# Patient Record
Sex: Female | Born: 1970 | Race: White | Hispanic: No | Marital: Single | Smoking: Never smoker
Health system: Southern US, Community
[De-identification: ages and names within clinical notes are randomized; demographics above are authoritative.]

## PROBLEM LIST (undated history)

## (undated) DIAGNOSIS — Z9289 Personal history of other medical treatment: Secondary | ICD-10-CM

## (undated) DIAGNOSIS — Z803 Family history of malignant neoplasm of breast: Secondary | ICD-10-CM

## (undated) DIAGNOSIS — F419 Anxiety disorder, unspecified: Secondary | ICD-10-CM

## (undated) DIAGNOSIS — R519 Headache, unspecified: Secondary | ICD-10-CM

## (undated) DIAGNOSIS — A6000 Herpesviral infection of urogenital system, unspecified: Secondary | ICD-10-CM

## (undated) DIAGNOSIS — R51 Headache: Secondary | ICD-10-CM

## (undated) HISTORY — DX: Family history of malignant neoplasm of breast: Z80.3

## (undated) HISTORY — DX: Anxiety disorder, unspecified: F41.9

## (undated) HISTORY — DX: Headache: R51

## (undated) HISTORY — DX: Headache, unspecified: R51.9

## (undated) HISTORY — DX: Herpesviral infection of urogenital system, unspecified: A60.00

## (undated) HISTORY — PX: NOSE SURGERY: SHX723

## (undated) HISTORY — DX: Personal history of other medical treatment: Z92.89

## (undated) HISTORY — PX: FRACTURE SURGERY: SHX138

---

## 1993-10-09 HISTORY — PX: KNEE ARTHROSCOPY: SUR90

## 1998-10-09 HISTORY — PX: APPENDECTOMY: SHX54

## 2014-06-02 LAB — HM PAP SMEAR

## 2015-05-17 ENCOUNTER — Other Ambulatory Visit: Payer: Self-pay | Admitting: Obstetrics and Gynecology

## 2015-05-17 DIAGNOSIS — R928 Other abnormal and inconclusive findings on diagnostic imaging of breast: Secondary | ICD-10-CM

## 2015-05-18 ENCOUNTER — Ambulatory Visit
Admission: RE | Admit: 2015-05-18 | Discharge: 2015-05-18 | Disposition: A | Discharge status: Home / Self Care | Payer: BLUE CROSS/BLUE SHIELD | Source: Ambulatory Visit | Attending: Obstetrics and Gynecology | Admitting: Obstetrics and Gynecology

## 2015-05-18 ENCOUNTER — Ambulatory Visit: Payer: Self-pay

## 2015-05-18 DIAGNOSIS — R928 Other abnormal and inconclusive findings on diagnostic imaging of breast: Secondary | ICD-10-CM | POA: Insufficient documentation

## 2015-05-18 LAB — HM MAMMOGRAPHY

## 2017-02-26 ENCOUNTER — Other Ambulatory Visit: Payer: Self-pay | Admitting: Obstetrics and Gynecology

## 2017-02-26 DIAGNOSIS — Z1231 Encounter for screening mammogram for malignant neoplasm of breast: Secondary | ICD-10-CM

## 2017-03-01 ENCOUNTER — Other Ambulatory Visit: Payer: Self-pay | Admitting: Obstetrics and Gynecology

## 2017-03-02 ENCOUNTER — Ambulatory Visit
Admission: RE | Admit: 2017-03-02 | Discharge: 2017-03-02 | Disposition: A | Discharge status: Home / Self Care | Payer: BLUE CROSS/BLUE SHIELD | Source: Ambulatory Visit | Attending: Obstetrics and Gynecology | Admitting: Obstetrics and Gynecology

## 2017-03-02 DIAGNOSIS — Z1231 Encounter for screening mammogram for malignant neoplasm of breast: Secondary | ICD-10-CM | POA: Diagnosis not present

## 2017-03-03 ENCOUNTER — Encounter: Payer: Self-pay | Admitting: Obstetrics and Gynecology

## 2017-03-06 ENCOUNTER — Other Ambulatory Visit: Payer: Self-pay

## 2017-03-06 ENCOUNTER — Telehealth: Payer: Self-pay | Admitting: Obstetrics and Gynecology

## 2017-03-06 MED ORDER — JUNEL FE 1/20 1-20 MG-MCG PO TABS: 1.0000 | ORAL_TABLET | Freq: Every day | ORAL | 2 refills | Status: DC

## 2017-03-06 NOTE — Telephone Encounter (Signed)
Patient has annual scheduled for June 13th.  [facility] [address] [place]

## 2017-03-06 NOTE — Telephone Encounter (Signed)
Pt aware refills eRx'd. 

## 2017-03-16 ENCOUNTER — Telehealth: Payer: Self-pay | Admitting: Obstetrics and Gynecology

## 2017-03-16 NOTE — Telephone Encounter (Signed)
Pt aware refills given on 03/06/17.

## 2017-03-16 NOTE — Telephone Encounter (Signed)
Patient had to change her appt for her annual to July 17th, however she will need a refill on her bc.   She will be out of town.

## 2017-03-16 NOTE — Telephone Encounter (Signed)
Pt should have enough RF, like RN said. Check with pharmacy to make sure they have them. If not, I'll resend. Thx.

## 2017-03-21 ENCOUNTER — Ambulatory Visit: Payer: Self-pay | Admitting: Obstetrics and Gynecology

## 2017-03-27 ENCOUNTER — Other Ambulatory Visit: Payer: Self-pay | Admitting: Obstetrics and Gynecology

## 2017-04-09 ENCOUNTER — Telehealth: Payer: Self-pay

## 2017-04-09 NOTE — Telephone Encounter (Signed)
Pt has appt c [facility] on the 17th.  She's been experiencing some migraine/bad headaches the last few weeks.  Thinks it's related to menopause b/c she's having sxs like hot flashes, and she been reading a lot about it.  She is getting ready to go on vacation for 10d and wants to know what she can take to get relief in the meantime.  Adv to per CLG needs workup from PCP which she states she does not have.  Adv to avoid triggers such as cheese, ETOH (which will be involved on vac), [place], chocolate, caffeine, red wine.  May take Excedrine Migraine, shield eyes from sun.  Pt aware I will send this msg to [facility].  However I can't promise when she will hear back from her as she in on vac in a remote area.  [phone]

## 2017-04-16 NOTE — Telephone Encounter (Signed)
LM for pt. Given new onset of bad headaches, pt needs to f/u with PCP for further eval. May be hormonal but don't want to assume that.

## 2017-04-24 ENCOUNTER — Ambulatory Visit (INDEPENDENT_AMBULATORY_CARE_PROVIDER_SITE_OTHER): Payer: BLUE CROSS/BLUE SHIELD | Admitting: Obstetrics and Gynecology

## 2017-04-24 ENCOUNTER — Encounter: Payer: Self-pay | Admitting: Obstetrics and Gynecology

## 2017-04-24 VITALS — BP 120/76 | HR 76 | Ht 71.0 in | Wt 174.0 lb

## 2017-04-24 DIAGNOSIS — Z0000 Encounter for general adult medical examination without abnormal findings: Secondary | ICD-10-CM

## 2017-04-24 DIAGNOSIS — Z3041 Encounter for surveillance of contraceptive pills: Secondary | ICD-10-CM | POA: Diagnosis not present

## 2017-04-24 DIAGNOSIS — Z113 Encounter for screening for infections with a predominantly sexual mode of transmission: Secondary | ICD-10-CM | POA: Diagnosis not present

## 2017-04-24 DIAGNOSIS — Z01419 Encounter for gynecological examination (general) (routine) without abnormal findings: Secondary | ICD-10-CM | POA: Diagnosis not present

## 2017-04-24 DIAGNOSIS — A6004 Herpesviral vulvovaginitis: Secondary | ICD-10-CM | POA: Diagnosis not present

## 2017-04-24 DIAGNOSIS — Z1321 Encounter for screening for nutritional disorder: Secondary | ICD-10-CM | POA: Diagnosis not present

## 2017-04-24 DIAGNOSIS — Z124 Encounter for screening for malignant neoplasm of cervix: Secondary | ICD-10-CM

## 2017-04-24 DIAGNOSIS — Z13220 Encounter for screening for lipoid disorders: Secondary | ICD-10-CM

## 2017-04-24 DIAGNOSIS — A6000 Herpesviral infection of urogenital system, unspecified: Secondary | ICD-10-CM | POA: Insufficient documentation

## 2017-04-24 MED ORDER — JUNEL FE 1/20 1-20 MG-MCG PO TABS: 1.0000 | ORAL_TABLET | Freq: Every day | ORAL | 12 refills | Status: DC

## 2017-04-24 NOTE — Progress Notes (Signed)
Chief Complaint  Patient presents with  . Gynecologic Exam    headaches week before cycles     HPI:      [name] is a 46 y.o. G0P0000 who LMP was Patient's last menstrual period was 03/19/2017 (approximate)., presents today for her annual examination.  Her menses are regular every 1-2 months very light, lasting 2 days.  Dysmenorrhea none. She does not have intermenstrual bleeding. She has had tension type headaches the past 2 pill packs on the 3rd wk of OCPs. NSAIDs not helpful. No hx of migraines.   Sex activity: single partner, contraception - OCP (estrogen/progesterone).  Last Pap: June 02, 2014  Results were: no abnormalities /neg HPV DNA  Hx of STDs: HSV, takes valtrex prn sx. Doesn't need Rx RF currently.  Last mammogram:03/02/17  Results were: normal--routine follow-up in 12 months There is a FH of breast cancer in her PGM, genetic testing not indicated. There is no FH of ovarian cancer. The patient does do self-breast exams.  Tobacco use: The patient denies current or previous tobacco use. Alcohol use: glass of wine with dinner Exercise: very active  She does get adequate calcium and Vitamin D in her diet.  She would like fasting labs this yr.   Past Medical History:  Diagnosis Date  . Anxiety   . Family history of breast cancer   . Head ache   . Herpes genitalis   . History of mammogram 04/2013; 05/18/15   neg; neg  . History of Papanicolaou smear of cervix 12/14/10; 06/02/14   -/-; -/-    Past Surgical History:  Procedure Laterality Date  . APPENDECTOMY  2000  . KNEE ARTHROSCOPY Left 1995  . NOSE SURGERY      Family History  Problem Relation Age of Onset  . Breast cancer Paternal Grandmother 465  . Cancer Maternal Grandfather 75       brain, lung  . Thyroid disease Mother        hypothyroidism  . Cancer Maternal Grandmother [id number]       stomach    Social History   Social History  . Marital status: Single    Spouse name: N/A  . Number of  children: 0  . Years of education: 5316   Occupational History  . manufacturing     [place]   Social History Main Topics  . Smoking status: Never Smoker  . Smokeless tobacco: Never Used  . Alcohol use Yes     Comment: occ  . Drug use: No  . Sexual activity: Yes    Birth control/ protection: Pill   Other Topics Concern  . Not on file   Social History Narrative  . No narrative on file     Current Outpatient Prescriptions:  .  JUNEL FE 1/20 1-20 MG-MCG tablet, Take 1 tablet by mouth daily., Disp: 1 Package, Rfl: 12 .  valACYclovir (VALTREX) 500 MG tablet, TAKE 1 TABLET BY MOUTH TWICE DAILY FOR 3 DAYS WITH SYMPTOMS OR DAILY AS NEEDED FOR SYMPTOMS, Disp: 30 tablet, Rfl: 0  ROS:  Review of Systems  Constitutional: Negative for fatigue, fever and unexpected weight change.  Respiratory: Negative for cough, shortness of breath and wheezing.   Cardiovascular: Negative for chest pain, palpitations and leg swelling.  Gastrointestinal: Negative for blood in stool, constipation, diarrhea, nausea and vomiting.  Endocrine: Negative for cold intolerance, heat intolerance and polyuria.  Genitourinary: Negative for dyspareunia, dysuria, flank pain, frequency, genital sores, hematuria, menstrual problem, pelvic  pain, urgency, vaginal bleeding, vaginal discharge and vaginal pain.  Musculoskeletal: Negative for back pain, joint swelling and myalgias.  Skin: Negative for rash.  Neurological: Positive for headaches. Negative for dizziness, syncope, light-headedness and numbness.  Hematological: Negative for adenopathy.  Psychiatric/Behavioral: Negative for agitation, confusion, sleep disturbance and suicidal ideas. The patient is not nervous/anxious.      Objective: BP 120/76 (BP Location: Left Arm, Patient Position: Sitting, Cuff Size: Normal)   Pulse 76   Ht 5\' 11"  (1.803 m)   Wt 174 lb (78.9 kg)   LMP 03/19/2017 (Approximate)   BMI 24.27 kg/m    Physical Exam    Constitutional: She is oriented to person, place, and time. She appears well-developed and well-nourished.  Genitourinary: Vagina normal and uterus normal. There is no rash or tenderness on the right labia. There is no rash or tenderness on the left labia. No erythema or tenderness in the vagina. No vaginal discharge found. Right adnexum does not display mass and does not display tenderness. Left adnexum does not display mass and does not display tenderness. Cervix does not exhibit motion tenderness or polyp. Uterus is not enlarged or tender.  Neck: Normal range of motion. No thyromegaly present.  Cardiovascular: Normal rate, regular rhythm and normal heart sounds.   No murmur heard. Pulmonary/Chest: Effort normal and breath sounds normal. Right breast exhibits no mass, no nipple discharge, no skin change and no tenderness. Left breast exhibits no mass, no nipple discharge, no skin change and no tenderness.  Abdominal: Soft. There is no tenderness. There is no guarding.  Musculoskeletal: Normal range of motion.  Neurological: She is alert and oriented to person, place, and time. No cranial nerve deficit.  Psychiatric: She has a normal mood and affect. Her behavior is normal.  Vitals reviewed.   Assessment/Plan: Encounter for annual routine gynecological examination  Cervical cancer screening - Plan: IGP, Aptima HPV  Screening for STD (sexually transmitted disease) - Plan: IGP, Aptima HPV  Herpes simplex vulvovaginitis - Will call for valtrex RF prn.  Encounter for surveillance of contraceptive pills - OCP RF. Follow headaches 3rd wk of OCPs. - Plan: JUNEL FE 1/20 1-20 MG-MCG tablet  Blood tests for routine general physical examination - Plan: Comprehensive metabolic panel, Lipid panel, VITAMIN D 25 Hydroxy (Vit-D Deficiency, Fractures)  Screening cholesterol level - Plan: Lipid panel  Encounter for vitamin deficiency screening - Plan: VITAMIN D 25 Hydroxy (Vit-D Deficiency,  Fractures)             GYN counsel mammography screening, adequate intake of calcium and vitamin D, diet and exercise     F/U  Return in about 1 year (around 04/24/2018).  Aseneth Hack B. Lasharn Bufkin, PA-C 04/24/2017 4:37 PM

## 2017-04-27 LAB — IGP, APTIMA HPV
HPV Aptima: NEGATIVE
PAP Smear Comment: 0

## 2017-07-02 ENCOUNTER — Encounter: Payer: Self-pay | Admitting: Physician Assistant

## 2017-07-02 ENCOUNTER — Telehealth: Payer: Self-pay | Admitting: General Practice

## 2017-07-02 ENCOUNTER — Ambulatory Visit (INDEPENDENT_AMBULATORY_CARE_PROVIDER_SITE_OTHER): Payer: BLUE CROSS/BLUE SHIELD | Admitting: Physician Assistant

## 2017-07-02 VITALS — BP 134/80 | HR 72 | Temp 98.3°F | Resp 16 | Ht 71.0 in | Wt 176.0 lb

## 2017-07-02 DIAGNOSIS — Z7689 Persons encountering health services in other specified circumstances: Secondary | ICD-10-CM

## 2017-07-02 DIAGNOSIS — H6122 Impacted cerumen, left ear: Secondary | ICD-10-CM

## 2017-07-02 DIAGNOSIS — J0110 Acute frontal sinusitis, unspecified: Secondary | ICD-10-CM

## 2017-07-02 MED ORDER — DOXYCYCLINE HYCLATE 100 MG PO TABS: 100.0000 mg | ORAL_TABLET | Freq: Two times a day (BID) | ORAL | 0 refills | Status: AC

## 2017-07-02 NOTE — Progress Notes (Signed)
Patient: Dana Patel Female    DOB: Aug 06, 1971   46 y.o.   MRN: [id number] Visit Date: 07/02/2017  Today's Provider: Trey Sailors, PA-C   Chief Complaint  Patient presents with  . Establish Care  . Sinusitis   Subjective:      Dana Patel is a 46 y/o woman previously seen at this practice by Dr. Loreta Ave and then Dr. Elease Hashimoto who is presenting today to re-establish care. She lives in [place] currently. Works at [employer]. She is single. She is not in a relationship. She is sexually active with a single female partner, no concern for STI. Her paternal grandmother had hx of breast cancer when >60, and possible colon cancer. Mother and father with colon polyps but unsure if precancerous/cancerous. Does not smoke, use drugs.  PAP/HPV: 04/24/2017, normal and neg Mammogram: 03/02/2017, normal Colonoscopy: never  Due for Tdap.   Had history of headaches, these have improved on birth control. Takes Valtrec PRN for genital HSV.  Sinusitis  This is a new problem. The current episode started in the past 7 days. The problem has been gradually worsening since onset. There has been no fever. Associated symptoms include congestion, coughing, ear pain, headaches, a hoarse voice, sinus pressure and a sore throat. Pertinent negatives include no chills, diaphoresis, shortness of breath or sneezing. Past treatments include spray decongestants and oral decongestants.        Past Surgical History:  Procedure Laterality Date  . APPENDECTOMY  2000  . KNEE ARTHROSCOPY Left 1995  . NOSE SURGERY      Allergies  Allergen Reactions  . Amoxicillin    Past Medical History:  Diagnosis Date  . Anxiety   . Family history of breast cancer   . Head ache   . Herpes genitalis   . History of mammogram 04/2013; 05/18/15   neg; neg  . History of Papanicolaou smear of cervix 12/14/10; 06/02/14   -/-; -/-   Family History  Problem Relation Age of Onset  . Breast cancer Paternal Grandmother 56    . Cancer Maternal Grandfather 75       brain, lung  . Thyroid disease Mother        hypothyroidism  . Cancer Maternal Grandmother 75       stomach  . Atrial fibrillation Father   . Basal cell carcinoma Father   . Healthy Sister   . Stroke Paternal Grandfather      Current Outpatient Prescriptions:  .  JUNEL FE 1/20 1-20 MG-MCG tablet, Take 1 tablet by mouth daily., Disp: 1 Package, Rfl: 12 .  valACYclovir (VALTREX) 500 MG tablet, TAKE 1 TABLET BY MOUTH TWICE DAILY FOR 3 DAYS WITH SYMPTOMS OR DAILY AS NEEDED FOR SYMPTOMS, Disp: 30 tablet, Rfl: 0  Review of Systems  Constitutional: Negative for activity change, appetite change, chills, diaphoresis, fatigue, fever and unexpected weight change.  HENT: Positive for congestion, ear pain, hearing loss (Left ear feels "clogged"), hoarse voice, postnasal drip, sinus pain, sinus pressure, sore throat and voice change. Negative for ear discharge, nosebleeds, rhinorrhea, sneezing, tinnitus and trouble swallowing.   Eyes: Negative.   Respiratory: Positive for cough and chest tightness. Negative for apnea, choking, shortness of breath, wheezing and stridor.   Cardiovascular: Negative.   Gastrointestinal: Negative.   Endocrine: Negative.   Genitourinary: Negative.   Musculoskeletal: Negative.   Skin: Negative.   Allergic/Immunologic: Positive for environmental allergies.  Neurological: Positive for headaches. Negative for dizziness and light-headedness.  Hematological: Negative.   Psychiatric/Behavioral: Negative.     Social History  Substance Use Topics  . Smoking status: Never Smoker  . Smokeless tobacco: Never Used  . Alcohol use Yes     Comment: occ   Objective:   BP 134/80 (BP Location: Left Arm, Patient Position: Sitting, Cuff Size: Normal)   Pulse 72   Temp 98.3 F (36.8 C) (Oral)   Resp 16   Ht  (1.803 m)   Wt 176 lb (79.8 kg)   BMI 24.55 kg/m  Vitals:   07/02/17 1323  BP: 134/80  Pulse: 72  Resp: 16  Temp:  98.3 F (36.8 C)  TempSrc: Oral  Weight: 176 lb (79.8 kg)  Height:  (1.803 m)     Physical Exam  Constitutional: She is oriented to person, place, and time. She appears well-developed and well-nourished.  HENT:  Right Ear: Tympanic membrane and external ear normal.  Left Ear: Tympanic membrane, external ear and ear canal normal. Decreased hearing is noted.  Mouth/Throat: Oropharynx is clear and moist. No oropharyngeal exudate.  Some dark wax obscuring left TM that resolves with lavage. However, TM is opaque, decreased hearing remains.  Cardiovascular: Normal rate and regular rhythm.   Pulmonary/Chest: Effort normal and breath sounds normal.  Sees GYN  Abdominal: Soft. Bowel sounds are normal.  Genitourinary:  Genitourinary Comments: Sees GYN  Lymphadenopathy:    She has cervical adenopathy.  Neurological: She is alert and oriented to person, place, and time.  Skin: Skin is warm and dry.  Psychiatric: She has a normal mood and affect. Her behavior is normal.        Assessment & Plan:     1. Encounter to establish care  Needs Tdap. Will administer when feeling better. Patient to call back for nursing only visit that fits her schedule.  2. Hearing loss of left ear due to cerumen impaction  Wax cleaned, TM opaque. Likely 2/2 congestion.  3. Acute non-recurrent frontal sinusitis  - doxycycline (VIBRA-TABS) 100 MG tablet; Take 1 tablet (100 mg total) by mouth 2 (two) times daily.  Dispense: 20 tablet; Refill: 0  Return in about 1 month (around 08/01/2017) for nurse visit, Tdap.  The entirety of the information documented in the History of Present Illness, Review of Systems and Physical Exam were personally obtained by me. Portions of this information were initially documented by Kavin Leech, CMA and reviewed by me for thoroughness and accuracy.        Trey Sailors, PA-C  [facility]

## 2017-07-02 NOTE — Patient Instructions (Signed)

## 2017-09-19 ENCOUNTER — Other Ambulatory Visit: Payer: Self-pay | Admitting: Obstetrics and Gynecology

## 2018-01-18 ENCOUNTER — Other Ambulatory Visit: Payer: Self-pay | Admitting: Obstetrics and Gynecology

## 2018-04-02 ENCOUNTER — Other Ambulatory Visit: Payer: Self-pay | Admitting: Obstetrics and Gynecology

## 2018-04-02 DIAGNOSIS — Z1231 Encounter for screening mammogram for malignant neoplasm of breast: Secondary | ICD-10-CM

## 2018-04-04 ENCOUNTER — Ambulatory Visit
Admission: RE | Admit: 2018-04-04 | Discharge: 2018-04-04 | Disposition: A | Discharge status: Home / Self Care | Payer: BLUE CROSS/BLUE SHIELD | Source: Ambulatory Visit | Attending: Obstetrics and Gynecology | Admitting: Obstetrics and Gynecology

## 2018-04-04 DIAGNOSIS — Z1231 Encounter for screening mammogram for malignant neoplasm of breast: Secondary | ICD-10-CM | POA: Insufficient documentation

## 2018-04-06 ENCOUNTER — Other Ambulatory Visit: Payer: Self-pay | Admitting: Obstetrics and Gynecology

## 2018-04-06 ENCOUNTER — Encounter: Payer: Self-pay | Admitting: Obstetrics and Gynecology

## 2018-04-08 ENCOUNTER — Other Ambulatory Visit: Payer: Self-pay

## 2018-04-08 MED ORDER — VALACYCLOVIR HCL 500 MG PO TABS: ORAL_TABLET | ORAL | 0 refills | Status: DC

## 2018-05-03 ENCOUNTER — Other Ambulatory Visit: Payer: Self-pay | Admitting: Obstetrics and Gynecology

## 2018-05-03 DIAGNOSIS — Z3041 Encounter for surveillance of contraceptive pills: Secondary | ICD-10-CM

## 2018-05-08 ENCOUNTER — Other Ambulatory Visit: Payer: Self-pay | Admitting: Obstetrics and Gynecology

## 2018-05-28 ENCOUNTER — Encounter

## 2018-05-28 ENCOUNTER — Encounter: Payer: Self-pay | Admitting: Obstetrics and Gynecology

## 2018-05-28 ENCOUNTER — Ambulatory Visit (INDEPENDENT_AMBULATORY_CARE_PROVIDER_SITE_OTHER): Payer: BLUE CROSS/BLUE SHIELD | Admitting: Obstetrics and Gynecology

## 2018-05-28 VITALS — BP 100/70 | HR 55 | Ht 71.0 in | Wt 174.0 lb

## 2018-05-28 DIAGNOSIS — Z3041 Encounter for surveillance of contraceptive pills: Secondary | ICD-10-CM

## 2018-05-28 DIAGNOSIS — Z13220 Encounter for screening for lipoid disorders: Secondary | ICD-10-CM

## 2018-05-28 DIAGNOSIS — Z1231 Encounter for screening mammogram for malignant neoplasm of breast: Secondary | ICD-10-CM | POA: Diagnosis not present

## 2018-05-28 DIAGNOSIS — Z01411 Encounter for gynecological examination (general) (routine) with abnormal findings: Secondary | ICD-10-CM

## 2018-05-28 DIAGNOSIS — Z0000 Encounter for general adult medical examination without abnormal findings: Secondary | ICD-10-CM

## 2018-05-28 DIAGNOSIS — Z01419 Encounter for gynecological examination (general) (routine) without abnormal findings: Secondary | ICD-10-CM

## 2018-05-28 DIAGNOSIS — A6004 Herpesviral vulvovaginitis: Secondary | ICD-10-CM

## 2018-05-28 DIAGNOSIS — Z1239 Encounter for other screening for malignant neoplasm of breast: Secondary | ICD-10-CM

## 2018-05-28 MED ORDER — NORETHIN ACE-ETH ESTRAD-FE 1-20 MG-MCG PO TABS: 1.0000 | ORAL_TABLET | Freq: Every day | ORAL | 3 refills | Status: DC

## 2018-05-28 MED ORDER — VALACYCLOVIR HCL 500 MG PO TABS: ORAL_TABLET | ORAL | 0 refills | Status: DC

## 2018-05-28 NOTE — Progress Notes (Signed)
Chief Complaint  Patient presents with  . Gynecologic Exam     HPI:      [name] is a 47 y.o. G0P0000 who LMP was Patient's last menstrual period was 05/21/2018 (approximate)., presents today for her annual examination.  Her menses are regular every month, very light, lasting 2-3 days. Bleeding on 3rd wk of pills this yr instead of placebo pills, but no BTB. Pt tried going off OCPs and menses were normal.  Dysmenorrhea none. Headaches improved this yr, too. Wants to continue.   Sex activity: single partner, contraception - OCP (estrogen/progesterone).  Last Pap: 04/24/17  Results were: no abnormalities /neg HPV DNA  Hx of STDs: HSV, takes valtrex prn sx; sx triggered by stress.  Rx RF this yr.  Last mammogram: 04/04/18  Results were: normal--routine follow-up in 12 months There is a FH of breast cancer in her PGM, genetic testing not indicated. There is no FH of ovarian cancer. The patient does do self-breast exams.  Tobacco use: The patient denies current or previous tobacco use. Alcohol use: glass of wine with dinner Exercise: very active  She does get adequate calcium but not Vitamin D in her diet.  She would like fasting labs this yr. Didn't do last yr.   Past Medical History:  Diagnosis Date  . Anxiety   . Family history of breast cancer   . Head ache   . Herpes genitalis   . History of mammogram 04/2013; 05/18/15   neg; neg  . History of Papanicolaou smear of cervix 12/14/10; 06/02/14   -/-; -/-    Past Surgical History:  Procedure Laterality Date  . APPENDECTOMY  2000  . KNEE ARTHROSCOPY Left 1995  . NOSE SURGERY      Family History  Problem Relation Age of Onset  . Breast cancer Paternal Grandmother 75  . Cancer Maternal Grandfather 75       brain, lung  . Thyroid disease Mother        hypothyroidism  . Cancer Maternal Grandmother 75       stomach  . Atrial fibrillation Father   . Basal cell carcinoma Father   . Healthy Sister   . Stroke Paternal  Grandfather     Social History   Socioeconomic History  . Marital status: Single    Spouse name: Not on file  . Number of children: 0  . Years of education: 33  . Highest education level: Not on file  Occupational History  . Occupation: Set designer    Comment: [employer]  Social Needs  . Financial resource strain: Not on file  . Food insecurity:    Worry: Not on file    Inability: Not on file  . Transportation needs:    Medical: Not on file    Non-medical: Not on file  Tobacco Use  . Smoking status: Never Smoker  . Smokeless tobacco: Never Used  Substance and Sexual Activity  . Alcohol use: Yes    Comment: occ  . Drug use: No  . Sexual activity: Yes    Birth control/protection: Pill  Lifestyle  . Physical activity:    Days per week: Not on file    Minutes per session: Not on file  . Stress: Not on file  Relationships  . Social connections:    Talks on phone: Not on file    Gets together: Not on file    Attends religious service: Not on file    Active member of club  or organization: Not on file    Attends meetings of clubs or organizations: Not on file    Relationship status: Not on file  . Intimate partner violence:    Fear of current or ex partner: Not on file    Emotionally abused: Not on file    Physically abused: Not on file    Forced sexual activity: Not on file  Other Topics Concern  . Not on file  Social History Narrative  . Not on file     Current Outpatient Medications:  .  norethindrone-ethinyl estradiol (JUNEL FE 1/20) 1-20 MG-MCG tablet, Take 1 tablet by mouth daily., Disp: 3 Package, Rfl: 3 .  valACYclovir (VALTREX) 500 MG tablet, TAKE 1 TABLET BY MOUTH TWICE DAILY FOR 3 DAYS WITH SYMPTOMS OR DAILY AS NEEDED FOR SYMPTOMS, Disp: 30 tablet, Rfl: 0  ROS:  Review of Systems  Constitutional: Negative for fatigue, fever and unexpected weight change.  Respiratory: Negative for cough, shortness of breath and wheezing.   Cardiovascular:  Negative for chest pain, palpitations and leg swelling.  Gastrointestinal: Negative for blood in stool, constipation, diarrhea, nausea and vomiting.  Endocrine: Negative for cold intolerance, heat intolerance and polyuria.  Genitourinary: Negative for dyspareunia, dysuria, flank pain, frequency, genital sores, hematuria, menstrual problem, pelvic pain, urgency, vaginal bleeding, vaginal discharge and vaginal pain.  Musculoskeletal: Negative for back pain, joint swelling and myalgias.  Skin: Negative for rash.  Neurological: Positive for headaches. Negative for dizziness, syncope, light-headedness and numbness.  Hematological: Negative for adenopathy.  Psychiatric/Behavioral: Negative for agitation, confusion, sleep disturbance and suicidal ideas. The patient is not nervous/anxious.      Objective: BP 100/70   Pulse (!) 55   Ht 5\' 11"  (1.803 m)   Wt 174 lb (78.9 kg)   LMP 05/21/2018 (Approximate)   BMI 24.27 kg/m    Physical Exam  Constitutional: She is oriented to person, place, and time. She appears well-developed and well-nourished.  Genitourinary: Vagina normal and uterus normal. There is no rash or tenderness on the right labia. There is no rash or tenderness on the left labia. No erythema or tenderness in the vagina. No vaginal discharge found. Right adnexum does not display mass and does not display tenderness. Left adnexum does not display mass and does not display tenderness. Cervix does not exhibit motion tenderness or polyp. Uterus is not enlarged or tender.  Neck: Normal range of motion. No thyromegaly present.  Cardiovascular: Normal rate, regular rhythm and normal heart sounds.  No murmur heard. Pulmonary/Chest: Effort normal and breath sounds normal. Right breast exhibits no mass, no nipple discharge, no skin change and no tenderness. Left breast exhibits no mass, no nipple discharge, no skin change and no tenderness.  Abdominal: Soft. There is no tenderness. There is no  guarding.  Musculoskeletal: Normal range of motion.  Neurological: She is alert and oriented to person, place, and time. No cranial nerve deficit.  Psychiatric: She has a normal mood and affect. Her behavior is normal.  Vitals reviewed.   Assessment/Plan: Encounter for annual routine gynecological examination  Screening for breast cancer - Pt current on mammo.  Encounter for surveillance of contraceptive pills - OCP RF. - Plan: norethindrone-ethinyl estradiol (JUNEL FE 1/20) 1-20 MG-MCG tablet  Herpes simplex vulvovaginitis - Rx RF valtrex. F/u prn.  - Plan: valACYclovir (VALTREX) 500 MG tablet  Blood tests for routine general physical examination - Plan: Comprehensive metabolic panel, Lipid panel  Screening cholesterol level - Plan: Lipid panel  GYN counsel mammography screening, adequate intake of calcium and vitamin D, diet and exercise     F/U  Return in about 1 year (around 05/29/2019).  Izayah Miner B. Calise Dunckel, PA-C 05/28/2018 2:43 PM

## 2018-05-28 NOTE — Patient Instructions (Signed)
I value your feedback and entrusting us with your care. If you get a [facility] patient survey, I would appreciate you taking the time to let us know about your experience today. Thank you! 

## 2018-06-03 ENCOUNTER — Other Ambulatory Visit: Payer: Self-pay | Admitting: Obstetrics and Gynecology

## 2018-08-16 ENCOUNTER — Other Ambulatory Visit: Payer: Self-pay | Admitting: Obstetrics and Gynecology

## 2018-08-16 DIAGNOSIS — A6004 Herpesviral vulvovaginitis: Secondary | ICD-10-CM

## 2018-11-08 ENCOUNTER — Ambulatory Visit: Payer: BLUE CROSS/BLUE SHIELD | Admitting: Physician Assistant

## 2018-11-08 ENCOUNTER — Encounter: Payer: Self-pay | Admitting: Physician Assistant

## 2018-11-08 VITALS — BP 119/80 | HR 85 | Temp 98.5°F | Resp 16 | Wt 175.0 lb

## 2018-11-08 DIAGNOSIS — J069 Acute upper respiratory infection, unspecified: Secondary | ICD-10-CM

## 2018-11-08 DIAGNOSIS — B9789 Other viral agents as the cause of diseases classified elsewhere: Secondary | ICD-10-CM | POA: Diagnosis not present

## 2018-11-08 NOTE — Patient Instructions (Signed)

## 2018-11-08 NOTE — Progress Notes (Signed)
Patient: Dana Patel Female    DOB: Jan 10, 1971   48 y.o.   MRN: [id number] Visit Date: 11/08/2018  Today's Provider: Trey Sailors, PA-C   Chief Complaint  Patient presents with  . Sore Throat   Subjective:     HPI  Upper Respiratory Infection: Patient complains of symptoms of a URI, possible sinusitis. Symptoms include congestion, cough and sore throat. Onset of symptoms was 7 days ago, gradually worsening since that time. She also c/o congestion, cough described as productive of green sputum and productive cough with  green colored sputum for the past 4 days .  She is drinking plenty of fluids. Evaluation to date: seen previously and thought to have a viral URI. Treatment to date: antihistamines, cough suppressants and decongestants. She did an MD live visit who instructed her on URI symptomatic treatments. Patient is travelling on Monday and wants an antibiotic, she requested a z-pak from the md live but was denied.    Allergies  Allergen Reactions  . Amoxicillin      Current Outpatient Medications:  .  norethindrone-ethinyl estradiol (JUNEL FE 1/20) 1-20 MG-MCG tablet, Take 1 tablet by mouth daily., Disp: 3 Package, Rfl: 3 .  valACYclovir (VALTREX) 500 MG tablet, TAKE 1 TABLET BY MOUTH TWICE DAILY FOR 3 DAYS WITH SYMPTOMS OR DAILY AS NEEDED FOR SYMPTOMS, Disp: 30 tablet, Rfl: 0  Review of Systems  Constitutional: Negative.   HENT: Positive for congestion and sore throat.   Respiratory: Positive for cough.     Social History   Tobacco Use  . Smoking status: Never Smoker  . Smokeless tobacco: Never Used  Substance Use Topics  . Alcohol use: Yes    Comment: occ      Objective:   BP 119/80 (BP Location: Left Arm, Patient Position: Sitting, Cuff Size: Normal)   Pulse 85   Temp 98.5 F (36.9 C) (Oral)   Resp 16   Wt 175 lb (79.4 kg)   SpO2 97%   BMI 24.41 kg/m  Vitals:   11/08/18 0940  BP: 119/80  Pulse: 85  Resp: 16  Temp: 98.5 F (36.9 C)    TempSrc: Oral  SpO2: 97%  Weight: 175 lb (79.4 kg)     Physical Exam Constitutional:      Appearance: She is well-developed.  HENT:     Right Ear: Tympanic membrane, ear canal and external ear normal.     Left Ear: Tympanic membrane, ear canal and external ear normal.     Nose:     Right Sinus: No maxillary sinus tenderness or frontal sinus tenderness.     Left Sinus: No maxillary sinus tenderness or frontal sinus tenderness.     Mouth/Throat:     Pharynx: No oropharyngeal exudate.  Eyes:     General:        Right eye: Discharge present.        Left eye: Discharge present. Neck:     Musculoskeletal: Neck supple.  Cardiovascular:     Rate and Rhythm: Normal rate and regular rhythm.  Pulmonary:     Effort: Pulmonary effort is normal. No respiratory distress.     Breath sounds: Normal breath sounds. No rales.  Lymphadenopathy:     Cervical: No cervical adenopathy.  Skin:    General: Skin is warm and dry.  Psychiatric:        Behavior: Behavior normal.         Assessment & Plan  1. Viral URI with cough  Had extensive conversation with patient about course of viral illnesses being ten days and she is at seven, additionally has not had double sickening, history of sinus surgery, and no classic signs of bacterial sinus infection. Had in depth conversation about how green mucous is not indicative of bacterial sinus infection. She is very insistent on getting an antibiotic because she feels she should get a z-pak in her system to "get ahead" of it and she has had z-paks prior twice and her illnesses went away in three days. She says she really doesn't see the harm in getting an antibiotic now. I have explained there is no way to determine that the z-pak shortened her illness. Additionally, I have instructed patient fully on harms of unnecessary antibiotics including antibiotic resistance and c. Diff infection. I have instructed her that she can call back Monday if she is still  not feeling well. Patient is very upset about this and says she will just have to let me know on Monday.   Return if symptoms worsen or fail to improve.  The entirety of the information documented in the History of Present Illness, Review of Systems and Physical Exam were personally obtained by me. Portions of this information were initially documented by Rondel Baton, CMA and reviewed by me for thoroughness and accuracy.        Trey Sailors, PA-C  [facility]

## 2018-12-10 ENCOUNTER — Other Ambulatory Visit: Payer: Self-pay | Admitting: Obstetrics and Gynecology

## 2018-12-10 DIAGNOSIS — A6004 Herpesviral vulvovaginitis: Secondary | ICD-10-CM

## 2018-12-10 NOTE — Telephone Encounter (Signed)
Please advise 

## 2019-05-11 ENCOUNTER — Other Ambulatory Visit: Payer: Self-pay | Admitting: Obstetrics and Gynecology

## 2019-05-11 DIAGNOSIS — Z3041 Encounter for surveillance of contraceptive pills: Secondary | ICD-10-CM

## 2019-08-05 ENCOUNTER — Other Ambulatory Visit: Payer: Self-pay | Admitting: Obstetrics and Gynecology

## 2019-08-05 DIAGNOSIS — Z3041 Encounter for surveillance of contraceptive pills: Secondary | ICD-10-CM

## 2019-08-25 ENCOUNTER — Other Ambulatory Visit: Payer: Self-pay | Admitting: Physician Assistant

## 2019-08-25 DIAGNOSIS — Z1231 Encounter for screening mammogram for malignant neoplasm of breast: Secondary | ICD-10-CM

## 2019-08-26 NOTE — Progress Notes (Signed)
Chief Complaint  Patient presents with  . Gynecologic Exam     HPI:      [name] is a 48 y.o. G0P0000 who LMP was Patient's last menstrual period was 08/27/2019 (exact date)., presents today for her annual examination.  Her menses are absent this yr on OCPs.  Went off OCPs a couple months and menses were normal.  Dysmenorrhea none. Headaches improved.   Sex activity: single partner, contraception - OCP (estrogen/progesterone).  Last Pap: 04/24/17  Results were: no abnormalities /neg HPV DNA  Hx of STDs: HSV, takes valtrex prn sx; sx triggered by stress.    Last mammogram: 04/04/18  Results were: normal--routine follow-up in 12 months. Had mammo today, awaiting results.  There is a FH of breast cancer in her PGM, genetic testing not indicated. There is no FH of ovarian cancer. The patient does do self-breast exams.  Tobacco use: The patient denies current or previous tobacco use. Alcohol use: glass of wine with dinner  No drug use. Exercise: very active  She does get adequate calcium and Vitamin D in her diet.  She would like fasting labs this yr. Didn't do last yr.  BP elevated today but been very busy today. Normal BP at home on watch. FH HTN in dad. F/u prn.  Past Medical History:  Diagnosis Date  . Anxiety   . Family history of breast cancer   . Head ache   . Herpes genitalis   . History of mammogram 04/2013; 05/18/15   neg; neg  . History of Papanicolaou smear of cervix 12/14/10; 06/02/14   -/-; -/-    Past Surgical History:  Procedure Laterality Date  . APPENDECTOMY  2000  . KNEE ARTHROSCOPY Left 1995  . NOSE SURGERY      Family History  Problem Relation Age of Onset  . Breast cancer Paternal Grandmother 29  . Cancer Maternal Grandfather 8       brain, lung  . Thyroid disease Mother        hypothyroidism  . Hyperlipidemia Mother   . Cancer Maternal Grandmother 75       stomach  . Atrial fibrillation Father   . Basal cell carcinoma Father   .  Hypertension Father   . Healthy Sister   . Stroke Paternal Grandfather     Social History   Socioeconomic History  . Marital status: Single    Spouse name: Not on file  . Number of children: 0  . Years of education: 55  . Highest education level: Not on file  Occupational History  . Occupation: Psychologist, educational    Comment: [employer]  Social Needs  . Financial resource strain: Not on file  . Food insecurity    Worry: Not on file    Inability: Not on file  . Transportation needs    Medical: Not on file    Non-medical: Not on file  Tobacco Use  . Smoking status: Never Smoker  . Smokeless tobacco: Never Used  Substance and Sexual Activity  . Alcohol use: Yes    Comment: occ  . Drug use: No  . Sexual activity: Yes    Birth control/protection: Pill  Lifestyle  . Physical activity    Days per week: Not on file    Minutes per session: Not on file  . Stress: Not on file  Relationships  . Social Herbalist on phone: Not on file    Gets together: Not on file  Attends religious service: Not on file    Active member of club or organization: Not on file    Attends meetings of clubs or organizations: Not on file    Relationship status: Not on file  . Intimate partner violence    Fear of current or ex partner: Not on file    Emotionally abused: Not on file    Physically abused: Not on file    Forced sexual activity: Not on file  Other Topics Concern  . Not on file  Social History Narrative  . Not on file     Current Outpatient Medications:  .  norethindrone-ethinyl estradiol (MICROGESTIN FE 1/20) 1-20 MG-MCG tablet, Take 1 tablet by mouth daily., Disp: 84 tablet, Rfl: 3 .  valACYclovir (VALTREX) 500 MG tablet, TAKE 1 TABLET BY MOUTH TWICE DAILY FOR 3 DAYS WITH SYMPTOMS OR DAILY AS NEEDED FOR SYMPTOMS, Disp: 30 tablet, Rfl: 1  ROS:  Review of Systems  Constitutional: Negative for fatigue, fever and unexpected weight change.  Respiratory: Negative for  cough, shortness of breath and wheezing.   Cardiovascular: Negative for chest pain, palpitations and leg swelling.  Gastrointestinal: Negative for blood in stool, constipation, diarrhea, nausea and vomiting.  Endocrine: Negative for cold intolerance, heat intolerance and polyuria.  Genitourinary: Negative for dyspareunia, dysuria, flank pain, frequency, genital sores, hematuria, menstrual problem, pelvic pain, urgency, vaginal bleeding, vaginal discharge and vaginal pain.  Musculoskeletal: Negative for back pain, joint swelling and myalgias.  Skin: Negative for rash.  Neurological: Positive for headaches. Negative for dizziness, syncope, light-headedness and numbness.  Hematological: Negative for adenopathy.  Psychiatric/Behavioral: Negative for agitation, confusion, sleep disturbance and suicidal ideas. The patient is not nervous/anxious.      Objective: BP 130/90   Ht 5\' 11"  (1.803 m)   Wt 174 lb (78.9 kg)   LMP 08/27/2019 (Exact Date)   BMI 24.27 kg/m    Physical Exam Constitutional:      Appearance: She is well-developed.  Genitourinary:     Vagina and uterus normal.     No vaginal discharge, erythema or tenderness.     No cervical motion tenderness or polyp.     Uterus is not enlarged or tender.     No right or left adnexal mass present.     Right adnexa not tender.     Left adnexa not tender.  Neck:     Musculoskeletal: Normal range of motion.     Thyroid: No thyromegaly.  Cardiovascular:     Rate and Rhythm: Normal rate and regular rhythm.     Heart sounds: Normal heart sounds. No murmur.  Pulmonary:     Effort: Pulmonary effort is normal.     Breath sounds: Normal breath sounds.  Chest:     Breasts:        Right: No mass, nipple discharge, skin change or tenderness.        Left: No mass, nipple discharge, skin change or tenderness.  Abdominal:     Palpations: Abdomen is soft.     Tenderness: There is no abdominal tenderness. There is no guarding.   Musculoskeletal: Normal range of motion.  Neurological:     Mental Status: She is alert and oriented to person, place, and time.     Cranial Nerves: No cranial nerve deficit.  Psychiatric:        Behavior: Behavior normal.  Vitals signs reviewed.     Assessment/Plan: Encounter for annual routine gynecological examination  Encounter for screening mammogram for malignant neoplasm  of breast; pt current on mammo today.  Encounter for surveillance of contraceptive pills - Plan: norethindrone-ethinyl estradiol (MICROGESTIN FE 1/20) 1-20 MG-MCG tablet; OCP RF. F/u prn.  Blood tests for routine general physical examination - Plan: Comprehensive metabolic panel, Lipid panel  Screening cholesterol level - Plan: Lipid panel             Meds ordered this encounter  Medications  . norethindrone-ethinyl estradiol (MICROGESTIN FE 1/20) 1-20 MG-MCG tablet    Sig: Take 1 tablet by mouth daily.    Dispense:  84 tablet    Refill:  3    Order Specific Question:   Supervising Provider    Answer:   Nadara Mustard [[id number]]    GYN counsel mammography screening, adequate intake of calcium and vitamin D, diet and exercise     F/U  Return in about 1 year (around 08/26/2020).  Ludean Duhart B. Havannah Streat, PA-C 08/27/2019 3:40 PM

## 2019-08-27 ENCOUNTER — Other Ambulatory Visit: Payer: Self-pay

## 2019-08-27 ENCOUNTER — Ambulatory Visit (INDEPENDENT_AMBULATORY_CARE_PROVIDER_SITE_OTHER): Payer: BLUE CROSS/BLUE SHIELD | Admitting: Obstetrics and Gynecology

## 2019-08-27 ENCOUNTER — Encounter: Payer: Self-pay | Admitting: Obstetrics and Gynecology

## 2019-08-27 ENCOUNTER — Ambulatory Visit
Admission: RE | Admit: 2019-08-27 | Discharge: 2019-08-27 | Disposition: A | Discharge status: Home / Self Care | Payer: BLUE CROSS/BLUE SHIELD | Source: Ambulatory Visit | Attending: Physician Assistant | Admitting: Physician Assistant

## 2019-08-27 VITALS — BP 130/90 | Ht 71.0 in | Wt 174.0 lb

## 2019-08-27 DIAGNOSIS — Z13220 Encounter for screening for lipoid disorders: Secondary | ICD-10-CM

## 2019-08-27 DIAGNOSIS — Z1231 Encounter for screening mammogram for malignant neoplasm of breast: Secondary | ICD-10-CM

## 2019-08-27 DIAGNOSIS — Z0000 Encounter for general adult medical examination without abnormal findings: Secondary | ICD-10-CM

## 2019-08-27 DIAGNOSIS — Z3041 Encounter for surveillance of contraceptive pills: Secondary | ICD-10-CM

## 2019-08-27 DIAGNOSIS — Z01419 Encounter for gynecological examination (general) (routine) without abnormal findings: Secondary | ICD-10-CM

## 2019-08-27 MED ORDER — NORETHIN ACE-ETH ESTRAD-FE 1-20 MG-MCG PO TABS: 1.0000 | ORAL_TABLET | Freq: Every day | ORAL | 3 refills | Status: DC

## 2019-08-27 NOTE — Patient Instructions (Signed)
I value your feedback and entrusting us with your care. If you get a [facility] patient survey, I would appreciate you taking the time to let us know about your experience today. Thank you! 

## 2019-09-26 ENCOUNTER — Other Ambulatory Visit: Payer: Self-pay | Admitting: Obstetrics and Gynecology

## 2019-09-26 DIAGNOSIS — A6004 Herpesviral vulvovaginitis: Secondary | ICD-10-CM

## 2019-09-26 NOTE — Telephone Encounter (Signed)
Please advise 

## 2019-10-27 ENCOUNTER — Other Ambulatory Visit: Payer: Self-pay | Admitting: Obstetrics and Gynecology

## 2019-10-27 DIAGNOSIS — Z3041 Encounter for surveillance of contraceptive pills: Secondary | ICD-10-CM

## 2020-03-13 ENCOUNTER — Other Ambulatory Visit: Payer: Self-pay | Admitting: Obstetrics and Gynecology

## 2020-03-13 DIAGNOSIS — A6004 Herpesviral vulvovaginitis: Secondary | ICD-10-CM

## 2020-05-21 ENCOUNTER — Other Ambulatory Visit: Payer: Self-pay | Admitting: Obstetrics and Gynecology

## 2020-05-21 DIAGNOSIS — A6004 Herpesviral vulvovaginitis: Secondary | ICD-10-CM

## 2020-08-12 DIAGNOSIS — S0501XA Injury of conjunctiva and corneal abrasion without foreign body, right eye, initial encounter: Secondary | ICD-10-CM | POA: Diagnosis not present

## 2020-10-06 ENCOUNTER — Other Ambulatory Visit: Payer: Self-pay | Admitting: Obstetrics and Gynecology

## 2020-10-06 DIAGNOSIS — Z3041 Encounter for surveillance of contraceptive pills: Secondary | ICD-10-CM

## 2020-10-07 ENCOUNTER — Telehealth: Payer: Self-pay

## 2020-10-07 ENCOUNTER — Other Ambulatory Visit: Payer: Self-pay | Admitting: Obstetrics and Gynecology

## 2020-10-07 DIAGNOSIS — A6004 Herpesviral vulvovaginitis: Secondary | ICD-10-CM

## 2020-10-07 DIAGNOSIS — Z3041 Encounter for surveillance of contraceptive pills: Secondary | ICD-10-CM

## 2020-10-07 MED ORDER — NORETHIN ACE-ETH ESTRAD-FE 1-20 MG-MCG PO TABS: 1.0000 | ORAL_TABLET | Freq: Every day | ORAL | 0 refills | Status: DC

## 2020-10-07 NOTE — Telephone Encounter (Signed)
Pt calling; has appt in Feb 2022; needs refill sent to [facility].  [phone]  Left detailed msg refill eRx'd.

## 2020-11-16 ENCOUNTER — Ambulatory Visit: Payer: BLUE CROSS/BLUE SHIELD | Admitting: Obstetrics and Gynecology

## 2020-11-16 NOTE — Patient Instructions (Incomplete)
I value your feedback and you entrusting us with your care. If you get a [facility] patient survey, I would appreciate you taking the time to let us know about your experience today. Thank you! ? ? ?

## 2020-11-16 NOTE — Progress Notes (Deleted)
No chief complaint on file.    HPI:      Dana Patel is a 50 y.o. G0P0000 who LMP was No LMP recorded. (Menstrual status: Oral contraceptives)., presents today for her annual examination.  Her menses are absent this yr on OCPs.  Went off OCPs a couple months and menses were normal.  Dysmenorrhea none. Headaches improved.   Sex activity: single partner, contraception - OCP (estrogen/progesterone).  Last Pap: 04/24/17  Results were: no abnormalities /neg HPV DNA  Hx of STDs: HSV, takes valtrex prn sx; sx triggered by stress.    Last mammogram: 08/27/19  Results were: normal--routine follow-up in 12 months.   There is a FH of breast cancer in her PGM, genetic testing not indicated. There is no FH of ovarian cancer. The patient does do self-breast exams.  Tobacco use: The patient denies current or previous tobacco use. Alcohol use: glass of wine with dinner  No drug use. Exercise: very active   Colonoscopy: never  She does get adequate calcium and Vitamin D in her diet.  She would like fasting labs this yr. Didn't do last yr.  BP elevated today but been very busy today. Normal BP at home on watch. FH HTN in dad. F/u prn.  Past Medical History:  Diagnosis Date  . Anxiety   . Family history of breast cancer   . Head ache   . Herpes genitalis   . History of mammogram 04/2013; 05/18/15   neg; neg  . History of Papanicolaou smear of cervix 12/14/10; 06/02/14   -/-; -/-    Past Surgical History:  Procedure Laterality Date  . APPENDECTOMY  2000  . KNEE ARTHROSCOPY Left 1995  . NOSE SURGERY      Family History  Problem Relation Age of Onset  . Breast cancer Paternal Grandmother 34  . Cancer Maternal Grandfather 75       brain, lung  . Thyroid disease Mother        hypothyroidism  . Hyperlipidemia Mother   . Cancer Maternal Grandmother 75       stomach  . Atrial fibrillation Father   . Basal cell carcinoma Father   . Hypertension Father   . Healthy Sister   . Stroke  Paternal Grandfather     Social History   Socioeconomic History  . Marital status: Single    Spouse name: Not on file  . Number of children: 0  . Years of education: 36  . Highest education level: Not on file  Occupational History  . Occupation: Set designer    Comment: [place] [employer]  Tobacco Use  . Smoking status: Never Smoker  . Smokeless tobacco: Never Used  Vaping Use  . Vaping Use: Never used  Substance and Sexual Activity  . Alcohol use: Yes    Comment: occ  . Drug use: No  . Sexual activity: Yes    Birth control/protection: Pill  Other Topics Concern  . Not on file  Social History Narrative  . Not on file   Social Determinants of Health   Financial Resource Strain: Not on file  Food Insecurity: Not on file  Transportation Needs: Not on file  Physical Activity: Not on file  Stress: Not on file  Social Connections: Not on file  Intimate Partner Violence: Not on file     Current Outpatient Medications:  .  norethindrone-ethinyl estradiol (MICROGESTIN FE 1/20) 1-20 MG-MCG tablet, Take 1 tablet by mouth daily., Disp: 84 tablet, Rfl: 0 .  valACYclovir (VALTREX) 500 MG tablet, TAKE 1 TABLET BY MOUTH TWICE DAILY FOR 3 DAYS WITH SYMPTOMS OR ONCE DAILY AS NEEDED FOR SYMPTOMS, Disp: 30 tablet, Rfl: 0  ROS:  Review of Systems  Constitutional: Negative for fatigue, fever and unexpected weight change.  Respiratory: Negative for cough, shortness of breath and wheezing.   Cardiovascular: Negative for chest pain, palpitations and leg swelling.  Gastrointestinal: Negative for blood in stool, constipation, diarrhea, nausea and vomiting.  Endocrine: Negative for cold intolerance, heat intolerance and polyuria.  Genitourinary: Negative for dyspareunia, dysuria, flank pain, frequency, genital sores, hematuria, menstrual problem, pelvic pain, urgency, vaginal bleeding, vaginal discharge and vaginal pain.  Musculoskeletal: Negative for back pain, joint swelling and myalgias.   Skin: Negative for rash.  Neurological: Positive for headaches. Negative for dizziness, syncope, light-headedness and numbness.  Hematological: Negative for adenopathy.  Psychiatric/Behavioral: Negative for agitation, confusion, sleep disturbance and suicidal ideas. The patient is not nervous/anxious.      Objective: There were no vitals taken for this visit.   Physical Exam Constitutional:      Appearance: She is well-developed.  Genitourinary:     No vaginal discharge, erythema or tenderness.      Right Adnexa: not tender and no mass present.    Left Adnexa: not tender and no mass present.    No cervical motion tenderness or polyp.     Uterus is not enlarged or tender.  Breasts:     Right: No mass, nipple discharge, skin change or tenderness.     Left: No mass, nipple discharge, skin change or tenderness.    Neck:     Thyroid: No thyromegaly.  Cardiovascular:     Rate and Rhythm: Normal rate and regular rhythm.     Heart sounds: Normal heart sounds. No murmur heard.   Pulmonary:     Effort: Pulmonary effort is normal.     Breath sounds: Normal breath sounds.  Abdominal:     Palpations: Abdomen is soft.     Tenderness: There is no abdominal tenderness. There is no guarding.  Musculoskeletal:        General: Normal range of motion.     Cervical back: Normal range of motion.  Neurological:     Mental Status: She is alert and oriented to person, place, and time.     Cranial Nerves: No cranial nerve deficit.  Psychiatric:        Behavior: Behavior normal.  Vitals reviewed.     Assessment/Plan: Encounter for annual routine gynecological examination  Encounter for screening mammogram for malignant neoplasm of breast; pt current on mammo today.  Encounter for surveillance of contraceptive pills - Plan: norethindrone-ethinyl estradiol (MICROGESTIN FE 1/20) 1-20 MG-MCG tablet; OCP RF. F/u prn.  Blood tests for routine general physical examination - Plan:  Comprehensive metabolic panel, Lipid panel  Screening cholesterol level - Plan: Lipid panel             No orders of the defined types were placed in this encounter.   GYN counsel mammography screening, adequate intake of calcium and vitamin D, diet and exercise     F/U  No follow-ups on file.  Trumaine Wimer B. Alexsander Cavins, PA-C 11/16/2020 10:05 AM

## 2020-12-08 ENCOUNTER — Other Ambulatory Visit: Payer: Self-pay | Admitting: Obstetrics and Gynecology

## 2020-12-08 DIAGNOSIS — Z1231 Encounter for screening mammogram for malignant neoplasm of breast: Secondary | ICD-10-CM

## 2020-12-16 ENCOUNTER — Other Ambulatory Visit: Payer: Self-pay

## 2020-12-16 ENCOUNTER — Other Ambulatory Visit (HOSPITAL_COMMUNITY)
Admission: RE | Admit: 2020-12-16 | Discharge: 2020-12-16 | Disposition: A | Discharge status: Home / Self Care | Payer: 59 | Source: Ambulatory Visit | Attending: Obstetrics and Gynecology | Admitting: Obstetrics and Gynecology

## 2020-12-16 ENCOUNTER — Encounter: Payer: Self-pay | Admitting: Obstetrics and Gynecology

## 2020-12-16 ENCOUNTER — Ambulatory Visit (INDEPENDENT_AMBULATORY_CARE_PROVIDER_SITE_OTHER): Payer: 59 | Admitting: Obstetrics and Gynecology

## 2020-12-16 VITALS — BP 120/90 | Ht 71.0 in | Wt 184.0 lb

## 2020-12-16 DIAGNOSIS — Z13220 Encounter for screening for lipoid disorders: Secondary | ICD-10-CM | POA: Diagnosis not present

## 2020-12-16 DIAGNOSIS — Z3041 Encounter for surveillance of contraceptive pills: Secondary | ICD-10-CM

## 2020-12-16 DIAGNOSIS — Z0000 Encounter for general adult medical examination without abnormal findings: Secondary | ICD-10-CM

## 2020-12-16 DIAGNOSIS — Z1151 Encounter for screening for human papillomavirus (HPV): Secondary | ICD-10-CM

## 2020-12-16 DIAGNOSIS — Z1231 Encounter for screening mammogram for malignant neoplasm of breast: Secondary | ICD-10-CM

## 2020-12-16 DIAGNOSIS — Z01419 Encounter for gynecological examination (general) (routine) without abnormal findings: Secondary | ICD-10-CM

## 2020-12-16 DIAGNOSIS — R69 Illness, unspecified: Secondary | ICD-10-CM | POA: Diagnosis not present

## 2020-12-16 DIAGNOSIS — Z124 Encounter for screening for malignant neoplasm of cervix: Secondary | ICD-10-CM | POA: Insufficient documentation

## 2020-12-16 DIAGNOSIS — Z1211 Encounter for screening for malignant neoplasm of colon: Secondary | ICD-10-CM | POA: Diagnosis not present

## 2020-12-16 MED ORDER — NORETHIN ACE-ETH ESTRAD-FE 1-20 MG-MCG PO TABS: 1.0000 | ORAL_TABLET | Freq: Every day | ORAL | 3 refills | Status: DC

## 2020-12-16 NOTE — Patient Instructions (Signed)
I value your feedback and you entrusting us with your care. If you get a [facility] patient survey, I would appreciate you taking the time to let us know about your experience today. Thank you! ? ? ?

## 2020-12-16 NOTE — Progress Notes (Signed)
Chief Complaint  Patient presents with  . Gynecologic Exam    No concerns     HPI:      [name] is a 50 y.o. G0P0000 who LMP was Patient's last menstrual period was 12/06/2020 (approximate)., presents today for her annual examination. Her menses are infrequent with OCPs, lasting 2-10 days (rare for 10 days), no BTB, comes on placebo pills. Dysmenorrhea none. Headaches improved but does have one before her period.   Sex activity: single partner, contraception - OCP (estrogen/progesterone).  Last Pap: 04/24/17  Results were: no abnormalities /neg HPV DNA  Hx of STDs: HSV, takes valtrex prn sx; sx triggered by stress.    Last mammogram: 08/27/19  Results were: normal--routine follow-up in 12 months.  Has appt 3/22 There is a FH of breast cancer in her PGM, genetic testing not indicated. There is no FH of ovarian cancer. The patient does do self-breast exams.  Tobacco use: The patient denies current or previous tobacco use. Alcohol use: glass of wine with dinner  No drug use. Exercise: very active   Colonoscopy: never  She does get adequate calcium and Vitamin D in her diet.  She would like fasting labs this yr. Didn't do last yr.    Past Medical History:  Diagnosis Date  . Anxiety   . Family history of breast cancer   . Head ache   . Herpes genitalis   . History of mammogram 04/2013; 05/18/15   neg; neg  . History of Papanicolaou smear of cervix 12/14/10; 06/02/14   -/-; -/-    Past Surgical History:  Procedure Laterality Date  . APPENDECTOMY  2000  . KNEE ARTHROSCOPY Left 1995  . NOSE SURGERY      Family History  Problem Relation Age of Onset  . Breast cancer Paternal Grandmother 22  . Cancer Maternal Grandfather 75       brain, lung  . Thyroid disease Mother        hypothyroidism  . Hyperlipidemia Mother   . Cancer Maternal Grandmother 75       stomach  . Atrial fibrillation Father   . Basal cell carcinoma Father   . Hypertension Father   . Healthy  Sister   . Stroke Paternal Grandfather     Social History   Socioeconomic History  . Marital status: Single    Spouse name: Not on file  . Number of children: 0  . Years of education: 38  . Highest education level: Not on file  Occupational History  . Occupation: Set designer    Comment: [employer]  Tobacco Use  . Smoking status: Never Smoker  . Smokeless tobacco: Never Used  Vaping Use  . Vaping Use: Never used  Substance and Sexual Activity  . Alcohol use: Yes    Comment: occ  . Drug use: No  . Sexual activity: Yes    Birth control/protection: Pill  Other Topics Concern  . Not on file  Social History Narrative  . Not on file   Social Determinants of Health   Financial Resource Strain: Not on file  Food Insecurity: Not on file  Transportation Needs: Not on file  Physical Activity: Not on file  Stress: Not on file  Social Connections: Not on file  Intimate Partner Violence: Not on file     Current Outpatient Medications:  .  valACYclovir (VALTREX) 500 MG tablet, TAKE 1 TABLET BY MOUTH TWICE DAILY FOR 3 DAYS WITH SYMPTOMS OR ONCE DAILY AS NEEDED  FOR SYMPTOMS, Disp: 30 tablet, Rfl: 0 .  norethindrone-ethinyl estradiol (MICROGESTIN FE 1/20) 1-20 MG-MCG tablet, Take 1 tablet by mouth daily., Disp: 84 tablet, Rfl: 3  ROS:  Review of Systems  Constitutional: Negative for fatigue, fever and unexpected weight change.  Respiratory: Negative for cough, shortness of breath and wheezing.   Cardiovascular: Negative for chest pain, palpitations and leg swelling.  Gastrointestinal: Negative for blood in stool, constipation, diarrhea, nausea and vomiting.  Endocrine: Negative for cold intolerance, heat intolerance and polyuria.  Genitourinary: Negative for dyspareunia, dysuria, flank pain, frequency, genital sores, hematuria, menstrual problem, pelvic pain, urgency, vaginal bleeding, vaginal discharge and vaginal pain.  Musculoskeletal: Negative for back pain, joint  swelling and myalgias.  Skin: Negative for rash.  Neurological: Positive for headaches. Negative for dizziness, syncope, light-headedness and numbness.  Hematological: Negative for adenopathy.  Psychiatric/Behavioral: Negative for agitation, confusion, sleep disturbance and suicidal ideas. The patient is not nervous/anxious.      Objective: BP 120/90   Ht 5\' 11"  (1.803 m)   Wt 184 lb (83.5 kg)   LMP 12/06/2020 (Approximate)   BMI 25.66 kg/m    Physical Exam Constitutional:      Appearance: She is well-developed.  Genitourinary:     Vulva normal.     Right Labia: No rash, tenderness or lesions.    Left Labia: No tenderness, lesions or rash.    No vaginal discharge, erythema or tenderness.      Right Adnexa: not tender and no mass present.    Left Adnexa: not tender and no mass present.    No cervical motion tenderness, friability or polyp.     Uterus is not enlarged or tender.  Breasts:     Right: No mass, nipple discharge, skin change or tenderness.     Left: No mass, nipple discharge, skin change or tenderness.    Neck:     Thyroid: No thyromegaly.  Cardiovascular:     Rate and Rhythm: Normal rate and regular rhythm.     Heart sounds: Normal heart sounds. No murmur heard.   Pulmonary:     Effort: Pulmonary effort is normal.     Breath sounds: Normal breath sounds.  Abdominal:     Palpations: Abdomen is soft.     Tenderness: There is no abdominal tenderness. There is no guarding or rebound.  Musculoskeletal:        General: Normal range of motion.     Cervical back: Normal range of motion.  Lymphadenopathy:     Cervical: No cervical adenopathy.  Neurological:     General: No focal deficit present.     Mental Status: She is alert and oriented to person, place, and time.     Cranial Nerves: No cranial nerve deficit.  Skin:    General: Skin is warm and dry.  Psychiatric:        Mood and Affect: Mood normal.        Behavior: Behavior normal.        Thought  Content: Thought content normal.        Judgment: Judgment normal.  Vitals reviewed.     Assessment/Plan: Encounter for annual routine gynecological examination  Cervical cancer screening - Plan: Cytology - PAP  Screening for HPV (human papillomavirus) - Plan: Cytology - PAP  Encounter for surveillance of contraceptive pills - Plan: norethindrone-ethinyl estradiol (MICROGESTIN FE 1/20) 1-20 MG-MCG tablet; OCP RF  Encounter for screening mammogram for malignant neoplasm of breast; has mammo sched  Screening for colon  cancer - Plan: Ambulatory referral to Gastroenterology; Refer to GI for scr colonoscopy due to age  Blood tests for routine general physical examination - Plan: Comprehensive metabolic panel, Lipid panel  Screening cholesterol level - Plan: Lipid panel             Meds ordered this encounter  Medications  . norethindrone-ethinyl estradiol (MICROGESTIN FE 1/20) 1-20 MG-MCG tablet    Sig: Take 1 tablet by mouth daily.    Dispense:  84 tablet    Refill:  3    Order Specific Question:   Supervising Provider    Answer:   Nadara Mustard [[id number]]    GYN counsel mammography screening, adequate intake of calcium and vitamin D, diet and exercise     F/U  Return in about 1 year (around 12/16/2021).  Alicia B. Copland, PA-C 12/16/2020 2:39 PM

## 2020-12-20 LAB — CYTOLOGY - PAP
Comment: NEGATIVE
Diagnosis: NEGATIVE
High risk HPV: NEGATIVE

## 2020-12-22 ENCOUNTER — Ambulatory Visit
Admission: RE | Admit: 2020-12-22 | Discharge: 2020-12-22 | Disposition: A | Discharge status: Home / Self Care | Payer: 59 | Source: Ambulatory Visit | Attending: Obstetrics and Gynecology | Admitting: Obstetrics and Gynecology

## 2020-12-22 ENCOUNTER — Other Ambulatory Visit: Payer: Self-pay

## 2020-12-22 DIAGNOSIS — Z1231 Encounter for screening mammogram for malignant neoplasm of breast: Secondary | ICD-10-CM

## 2021-01-20 ENCOUNTER — Telehealth: Payer: Self-pay

## 2021-01-20 ENCOUNTER — Other Ambulatory Visit: Payer: Self-pay

## 2021-01-20 DIAGNOSIS — Z1211 Encounter for screening for malignant neoplasm of colon: Secondary | ICD-10-CM

## 2021-01-20 MED ORDER — PEG 3350-KCL-NA BICARB-NACL 420 G PO SOLR: 4000.0000 mL | Freq: Once | ORAL | 0 refills | Status: AC

## 2021-01-20 NOTE — Telephone Encounter (Signed)
Gastroenterology Pre-Procedure Review  Request Date: 04/04/21 Requesting Physician: Dr. Maximino Greenland  PATIENT REVIEW QUESTIONS: The patient responded to the following health history questions as indicated:    1. Are you having any GI issues? no 2. Do you have a personal history of Polyps? yes (grandmother colon polyps) 3. Do you have a family history of Colon Cancer or Polyps? no 4. Diabetes Mellitus? no 5. Joint replacements in the past 12 months?no 6. Major health problems in the past 3 months?no 7. Any artificial heart valves, MVP, or defibrillator?no    MEDICATIONS & ALLERGIES:    Patient reports the following regarding taking any anticoagulation/antiplatelet therapy:   Plavix, Coumadin, Eliquis, Xarelto, Lovenox, Pradaxa, Brilinta, or Effient? no Aspirin? no  Patient confirms/reports the following medications:  Current Outpatient Medications  Medication Sig Dispense Refill  . norethindrone-ethinyl estradiol (MICROGESTIN FE 1/20) 1-20 MG-MCG tablet Take 1 tablet by mouth daily. 84 tablet 3  . valACYclovir (VALTREX) 500 MG tablet TAKE 1 TABLET BY MOUTH TWICE DAILY FOR 3 DAYS WITH SYMPTOMS OR ONCE DAILY AS NEEDED FOR SYMPTOMS 30 tablet 0   No current facility-administered medications for this visit.    Patient confirms/reports the following allergies:  Allergies  Allergen Reactions  . Amoxicillin     No orders of the defined types were placed in this encounter.   AUTHORIZATION INFORMATION Primary Insurance: 1D#: Group #:  Secondary Insurance: 1D#: Group #:  SCHEDULE INFORMATION: Date: 04/04/21 Time: Location:[facility]

## 2021-02-16 ENCOUNTER — Other Ambulatory Visit: Payer: Self-pay | Admitting: Obstetrics and Gynecology

## 2021-02-16 DIAGNOSIS — A6004 Herpesviral vulvovaginitis: Secondary | ICD-10-CM

## 2021-02-16 MED ORDER — VALACYCLOVIR HCL 500 MG PO TABS: 500.0000 mg | ORAL_TABLET | Freq: Two times a day (BID) | ORAL | 0 refills | Status: DC

## 2021-02-17 NOTE — Telephone Encounter (Signed)
Pt aware by vm rx sent in.

## 2021-04-04 ENCOUNTER — Encounter: Admission: RE | Payer: Self-pay | Source: Home / Self Care

## 2021-04-04 ENCOUNTER — Ambulatory Visit: Admission: RE | Admit: 2021-04-04 | Payer: 59 | Source: Home / Self Care | Admitting: Gastroenterology

## 2021-04-04 SURGERY — COLONOSCOPY WITH PROPOFOL
Anesthesia: General

## 2021-04-06 ENCOUNTER — Other Ambulatory Visit: Payer: Self-pay | Admitting: Obstetrics and Gynecology

## 2021-04-06 DIAGNOSIS — A6004 Herpesviral vulvovaginitis: Secondary | ICD-10-CM

## 2021-05-16 ENCOUNTER — Other Ambulatory Visit: Payer: Self-pay | Admitting: Obstetrics and Gynecology

## 2021-05-16 DIAGNOSIS — A6004 Herpesviral vulvovaginitis: Secondary | ICD-10-CM

## 2021-05-17 ENCOUNTER — Other Ambulatory Visit: Payer: Self-pay | Admitting: Obstetrics and Gynecology

## 2021-05-17 ENCOUNTER — Encounter: Payer: Self-pay | Admitting: Obstetrics and Gynecology

## 2021-05-17 DIAGNOSIS — A6004 Herpesviral vulvovaginitis: Secondary | ICD-10-CM

## 2021-05-18 ENCOUNTER — Other Ambulatory Visit: Payer: Self-pay | Admitting: Obstetrics and Gynecology

## 2021-05-18 DIAGNOSIS — A6004 Herpesviral vulvovaginitis: Secondary | ICD-10-CM

## 2021-05-18 MED ORDER — VALACYCLOVIR HCL 500 MG PO TABS: ORAL_TABLET | ORAL | 1 refills | Status: DC

## 2021-05-18 NOTE — Progress Notes (Signed)
Rx RF valtrex prn sx 

## 2021-11-21 ENCOUNTER — Other Ambulatory Visit: Payer: Self-pay | Admitting: Obstetrics and Gynecology

## 2021-11-21 DIAGNOSIS — Z3041 Encounter for surveillance of contraceptive pills: Secondary | ICD-10-CM

## 2021-11-21 MED ORDER — NORETHIN ACE-ETH ESTRAD-FE 1-20 MG-MCG PO TABS: 1.0000 | ORAL_TABLET | Freq: Every day | ORAL | 0 refills | Status: DC

## 2021-11-21 NOTE — Telephone Encounter (Signed)
Verified AE scheduled 12/19/21. Refill sent. Patient aware.

## 2021-11-21 NOTE — Addendum Note (Signed)
Addended by: Kathlene Cote on: 11/21/2021 11:21 AM   Modules accepted: Orders

## 2021-11-21 NOTE — Telephone Encounter (Signed)
Patient report she was advised by pharmacy her refill was denied. She isn't past due for AE, but has scheduled 12/19/21. Requesting refill.

## 2021-12-12 ENCOUNTER — Encounter: Payer: Self-pay | Admitting: Obstetrics and Gynecology

## 2021-12-12 ENCOUNTER — Other Ambulatory Visit: Payer: Self-pay | Admitting: Obstetrics and Gynecology

## 2021-12-12 DIAGNOSIS — Z3041 Encounter for surveillance of contraceptive pills: Secondary | ICD-10-CM

## 2021-12-12 MED ORDER — NORETHIN ACE-ETH ESTRAD-FE 1-20 MG-MCG PO TABS: 1.0000 | ORAL_TABLET | Freq: Every day | ORAL | 0 refills | Status: DC

## 2021-12-12 MED ORDER — NORETHIN ACE-ETH ESTRAD-FE 1-20 MG-MCG PO TABS
1.0000 | ORAL_TABLET | Freq: Every day | ORAL | 0 refills | Status: DC
Start: 2021-12-12 — End: 2021-12-19

## 2021-12-12 NOTE — Progress Notes (Signed)
Rx RF OCPs till annual next wk.  ?

## 2021-12-19 ENCOUNTER — Other Ambulatory Visit: Payer: Self-pay

## 2021-12-19 ENCOUNTER — Ambulatory Visit (INDEPENDENT_AMBULATORY_CARE_PROVIDER_SITE_OTHER): Payer: 59 | Admitting: Obstetrics and Gynecology

## 2021-12-19 ENCOUNTER — Encounter: Payer: Self-pay | Admitting: Obstetrics and Gynecology

## 2021-12-19 VITALS — BP 138/90 | Ht 71.0 in | Wt 187.0 lb

## 2021-12-19 DIAGNOSIS — Z1231 Encounter for screening mammogram for malignant neoplasm of breast: Secondary | ICD-10-CM | POA: Diagnosis not present

## 2021-12-19 DIAGNOSIS — Z01419 Encounter for gynecological examination (general) (routine) without abnormal findings: Secondary | ICD-10-CM | POA: Diagnosis not present

## 2021-12-19 DIAGNOSIS — Z13220 Encounter for screening for lipoid disorders: Secondary | ICD-10-CM

## 2021-12-19 DIAGNOSIS — Z1211 Encounter for screening for malignant neoplasm of colon: Secondary | ICD-10-CM

## 2021-12-19 DIAGNOSIS — Z0000 Encounter for general adult medical examination without abnormal findings: Secondary | ICD-10-CM

## 2021-12-19 DIAGNOSIS — Z3041 Encounter for surveillance of contraceptive pills: Secondary | ICD-10-CM

## 2021-12-19 DIAGNOSIS — R69 Illness, unspecified: Secondary | ICD-10-CM | POA: Diagnosis not present

## 2021-12-19 DIAGNOSIS — A6004 Herpesviral vulvovaginitis: Secondary | ICD-10-CM

## 2021-12-19 MED ORDER — VALACYCLOVIR HCL 500 MG PO TABS: ORAL_TABLET | ORAL | 1 refills | Status: DC

## 2021-12-19 MED ORDER — NORETHIN ACE-ETH ESTRAD-FE 1-20 MG-MCG PO TABS: 1.0000 | ORAL_TABLET | Freq: Every day | ORAL | 4 refills | Status: DC

## 2021-12-19 NOTE — Patient Instructions (Signed)
I value your feedback and you entrusting us with your care. If you get a [facility] patient survey, I would appreciate you taking the time to let us know about your experience today. Thank you!  [facility]: [phone]      

## 2021-12-19 NOTE — Progress Notes (Signed)
? ?Chief Complaint  ?Patient presents with  ? Gynecologic Exam  ?  No concerns  ? ? ? ?HPI: ?     [name] is a 51 y.o. G0P0000 who LMP was No LMP recorded. (Menstrual status: Oral contraceptives)., presents today for her annual examination. Her menses are infrequent with OCPs, lasting 1-2 days, light flow, no BTB, comes on placebo pills. Dysmenorrhea none. Having headaches, bloating, and night sweats on placebo pills now every cycle. Tried continuous dosing and felt better. Would like to continue as cont dosing. Went off pills in not too distant future and had heavy period. ? ?Sex activity: off and on,  contraception - OCP (estrogen/progesterone).  ?Last Pap: 12/16/20 Results were: no abnormalities /neg HPV DNA  ?Hx of STDs: HSV, takes valtrex prn sx; sx triggered by stress.   ? ?Last mammogram: 12/22/20  Results were: normal--routine follow-up in 12 months.   ?There is a FH of breast cancer in her PGM, genetic testing not indicated. There is no FH of ovarian cancer. The patient does do self-breast exams. ? ?Tobacco use: The patient denies current or previous tobacco use. ?Alcohol use: glass of wine with dinner  ?No drug use. ?Exercise: very active ?  ?Colonoscopy: never; had to cancel last yr and never rescheduled.  ? ?She does get adequate calcium and Vitamin D in her diet. ? ?She would like fasting labs this yr. Didn't do last yr.  ? ? ?Past Medical History:  ?Diagnosis Date  ? Anxiety   ? Family history of breast cancer   ? Head ache   ? Herpes genitalis   ? History of mammogram 04/2013; 05/18/15  ? neg; neg  ? History of Papanicolaou smear of cervix 12/14/10; 06/02/14  ? -/-; -/-  ? ? ?Past Surgical History:  ?Procedure Laterality Date  ? APPENDECTOMY  2000  ? KNEE ARTHROSCOPY Left 1995  ? NOSE SURGERY    ? ? ?Family History  ?Problem Relation Age of Onset  ? Breast cancer Paternal Grandmother [id number]  ? Cancer Maternal Grandfather 2675  ?     brain, lung  ? Thyroid disease Mother   ?     hypothyroidism  ?  Hyperlipidemia Mother   ? Cancer Maternal Grandmother 7475  ?     stomach  ? Atrial fibrillation Father   ? Basal cell carcinoma Father   ? Hypertension Father   ? Healthy Sister   ? Stroke Paternal Grandfather   ? ? ?Social History  ? ?Socioeconomic History  ? Marital status: Single  ?  Spouse name: Not on file  ? Number of children: 0  ? Years of education: 5816  ? Highest education level: Not on file  ?Occupational History  ? Occupation: Set designermanufacturing  ?  Comment: [employer]  ?Tobacco Use  ? Smoking status: Never  ? Smokeless tobacco: Never  ?Vaping Use  ? Vaping Use: Never used  ?Substance and Sexual Activity  ? Alcohol use: Yes  ?  Comment: occ  ? Drug use: No  ? Sexual activity: Not Currently  ?  Birth control/protection: Pill  ?Other Topics Concern  ? Not on file  ?Social History Narrative  ? Not on file  ? ?Social Determinants of Health  ? ?Financial Resource Strain: Not on file  ?Food Insecurity: Not on file  ?Transportation Needs: Not on file  ?Physical Activity: Not on file  ?Stress: Not on file  ?Social Connections: Not on file  ?Intimate Partner Violence: Not on file  ? ? ? ?  Current Outpatient Medications:  ?  norethindrone-ethinyl estradiol-FE (MICROGESTIN FE 1/20) 1-20 MG-MCG tablet, Take 1 tablet by mouth daily. CONTINUOUS DOSING, Disp: 84 tablet, Rfl: 4 ?  valACYclovir (VALTREX) 500 MG tablet, TAKE 1 TABLET BY MOUTH TWICE DAILY X 3 DAYS AS NEEDED FOR SYMPTOMS OR 1 TABLET DAILY FOR PREVENTATIVE, Disp: 30 tablet, Rfl: 1 ? ?ROS: ? ?Review of Systems  ?Constitutional:  Negative for fatigue, fever and unexpected weight change.  ?Respiratory:  Negative for cough, shortness of breath and wheezing.   ?Cardiovascular:  Negative for chest pain, palpitations and leg swelling.  ?Gastrointestinal:  Negative for blood in stool, constipation, diarrhea, nausea and vomiting.  ?Endocrine: Negative for cold intolerance, heat intolerance and polyuria.  ?Genitourinary:  Negative for dyspareunia, dysuria, flank pain,  frequency, genital sores, hematuria, menstrual problem, pelvic pain, urgency, vaginal bleeding, vaginal discharge and vaginal pain.  ?Musculoskeletal:  Negative for back pain, joint swelling and myalgias.  ?Skin:  Negative for rash.  ?Neurological:  Negative for dizziness, syncope, light-headedness, numbness and headaches.  ?Hematological:  Negative for adenopathy.  ?Psychiatric/Behavioral:  Negative for agitation, confusion, sleep disturbance and suicidal ideas. The patient is not nervous/anxious.   ? ? ?Objective: ?BP 138/90   Ht 5\' 11"  (1.803 m)   Wt 187 lb (84.8 kg)   BMI 26.08 kg/m?  ? ? ?Physical Exam ?Constitutional:   ?   Appearance: She is well-developed.  ?Genitourinary:  ?   Vulva normal.  ?   Right Labia: No rash, tenderness or lesions. ?   Left Labia: No tenderness, lesions or rash. ?   No vaginal discharge, erythema or tenderness.  ? ?   Right Adnexa: not tender and no mass present. ?   Left Adnexa: not tender and no mass present. ?   No cervical motion tenderness, friability or polyp.  ?   Uterus is not enlarged or tender.  ?Breasts: ?   Right: No mass, nipple discharge, skin change or tenderness.  ?   Left: No mass, nipple discharge, skin change or tenderness.  ?Neck:  ?   Thyroid: No thyromegaly.  ?Cardiovascular:  ?   Rate and Rhythm: Normal rate and regular rhythm.  ?   Heart sounds: Normal heart sounds. No murmur heard. ?Pulmonary:  ?   Effort: Pulmonary effort is normal.  ?   Breath sounds: Normal breath sounds.  ?Abdominal:  ?   Palpations: Abdomen is soft.  ?   Tenderness: There is no abdominal tenderness. There is no guarding or rebound.  ?Musculoskeletal:     ?   General: Normal range of motion.  ?   Cervical back: Normal range of motion.  ?Lymphadenopathy:  ?   Cervical: No cervical adenopathy.  ?Neurological:  ?   General: No focal deficit present.  ?   Mental Status: She is alert and oriented to person, place, and time.  ?   Cranial Nerves: No cranial nerve deficit.  ?Skin: ?    General: Skin is warm and dry.  ?Psychiatric:     ?   Mood and Affect: Mood normal.     ?   Behavior: Behavior normal.     ?   Thought Content: Thought content normal.     ?   Judgment: Judgment normal.  ?Vitals reviewed.  ? ? ?Assessment/Plan: ?Encounter for annual routine gynecological examination ? ?Encounter for surveillance of contraceptive pills - Plan: norethindrone-ethinyl estradiol-FE (MICROGESTIN FE 1/20) 1-20 MG-MCG tablet; OCP RF. Will do continuous dosing as long as possible before placebo  pills. F/u prn.  ? ?Encounter for screening mammogram for malignant neoplasm of breast - Plan: MM 3D SCREEN BREAST BILATERAL; pt to schedule mammo ? ?Screening for colon cancer - Plan: Ambulatory referral to Gastroenterology; refer to GI, pt ready to reschedule colonoscopy ? ?Herpes simplex vulvovaginitis - Plan: valACYclovir (VALTREX) 500 MG tablet; Rx RF ? ?Blood tests for routine general physical examination - Plan: Comprehensive metabolic panel, Lipid panel ? ?Screening cholesterol level - Plan: Lipid panel ? ? ?           ?Meds ordered this encounter  ?Medications  ? valACYclovir (VALTREX) 500 MG tablet  ?  Sig: TAKE 1 TABLET BY MOUTH TWICE DAILY X 3 DAYS AS NEEDED FOR SYMPTOMS OR 1 TABLET DAILY FOR PREVENTATIVE  ?  Dispense:  30 tablet  ?  Refill:  1  ?  Order Specific Question:   Supervising Provider  ?  AnswerNadara Mustard [[id number]]  ? norethindrone-ethinyl estradiol-FE (MICROGESTIN FE 1/20) 1-20 MG-MCG tablet  ?  Sig: Take 1 tablet by mouth daily. CONTINUOUS DOSING  ?  Dispense:  84 tablet  ?  Refill:  4  ?  Order Specific Question:   Supervising Provider  ?  AnswerNadara Mustard [[id number]]  ? ? ?GYN counsel mammography screening, adequate intake of calcium and vitamin D, diet and exercise ? ? ?  F/U ? Return in about 1 year (around 12/20/2022). ? ?Arbadella Kimbler B. Greyson Riccardi, PA-C ?12/19/2021 ?1:46 PM ?

## 2021-12-29 ENCOUNTER — Ambulatory Visit
Admission: RE | Admit: 2021-12-29 | Discharge: 2021-12-29 | Disposition: A | Discharge status: Home / Self Care | Payer: 59 | Source: Ambulatory Visit | Attending: Obstetrics and Gynecology | Admitting: Obstetrics and Gynecology

## 2021-12-29 ENCOUNTER — Other Ambulatory Visit: Payer: Self-pay

## 2021-12-29 DIAGNOSIS — Z1231 Encounter for screening mammogram for malignant neoplasm of breast: Secondary | ICD-10-CM | POA: Insufficient documentation

## 2022-01-02 NOTE — Telephone Encounter (Signed)
Pls call pharm to see what can be done. New Rx sent 3/23 for cont dosing. Thx.

## 2022-01-12 ENCOUNTER — Encounter: Payer: Self-pay | Admitting: Obstetrics and Gynecology

## 2022-06-03 ENCOUNTER — Other Ambulatory Visit: Payer: Self-pay | Admitting: Obstetrics and Gynecology

## 2022-06-03 DIAGNOSIS — A6004 Herpesviral vulvovaginitis: Secondary | ICD-10-CM

## 2022-06-04 DIAGNOSIS — Z1211 Encounter for screening for malignant neoplasm of colon: Secondary | ICD-10-CM | POA: Diagnosis not present

## 2022-06-05 ENCOUNTER — Other Ambulatory Visit: Payer: Self-pay | Admitting: Obstetrics and Gynecology

## 2022-06-05 DIAGNOSIS — A6004 Herpesviral vulvovaginitis: Secondary | ICD-10-CM

## 2022-06-05 MED ORDER — VALACYCLOVIR HCL 500 MG PO TABS: ORAL_TABLET | ORAL | 2 refills | Status: DC

## 2022-06-05 NOTE — Progress Notes (Signed)
Rx RF valtrex, taking almost daily

## 2022-06-05 NOTE — Telephone Encounter (Signed)
Spoke with patient. She states she is taking the Valtrex daily for preventative. She report she may skip 4-5 days here or there, but she only has a few pills left and she is going out of town for 10 days. Advise Helmut Muster is out of the office today, but may check messages. She will return to office tomorrow.

## 2022-06-05 NOTE — Telephone Encounter (Signed)
Rx RF eRxd.  

## 2022-06-05 NOTE — Telephone Encounter (Signed)
[facility] VOICEMAIL: Patient requested refill for Valacyclovir that was denied. She tried to request thru my chart, but it said it can not be refilled thru that site at this time. Please advise if she is able to get a refill. [phone]

## 2022-06-29 DIAGNOSIS — J0100 Acute maxillary sinusitis, unspecified: Secondary | ICD-10-CM | POA: Diagnosis not present

## 2022-06-29 DIAGNOSIS — J302 Other seasonal allergic rhinitis: Secondary | ICD-10-CM | POA: Diagnosis not present

## 2022-07-20 DIAGNOSIS — R519 Headache, unspecified: Secondary | ICD-10-CM | POA: Diagnosis not present

## 2022-07-20 DIAGNOSIS — J0100 Acute maxillary sinusitis, unspecified: Secondary | ICD-10-CM | POA: Diagnosis not present

## 2022-08-10 IMAGING — MG MM DIGITAL SCREENING BILAT W/ TOMO AND CAD
8 series · 8 of 24 positions shown · non-contrast
Comparison: Previous exam(s).

CLINICAL DATA: Screening.

EXAM:
DIGITAL SCREENING BILATERAL MAMMOGRAM WITH TOMOSYNTHESIS AND CAD
TECHNIQUE: Bilateral screening digital craniocaudal and mediolateral oblique
mammograms were obtained. Bilateral screening digital breast
tomosynthesis was performed. The images were evaluated with
computer-aided detection.

[R MLO synth-2D]
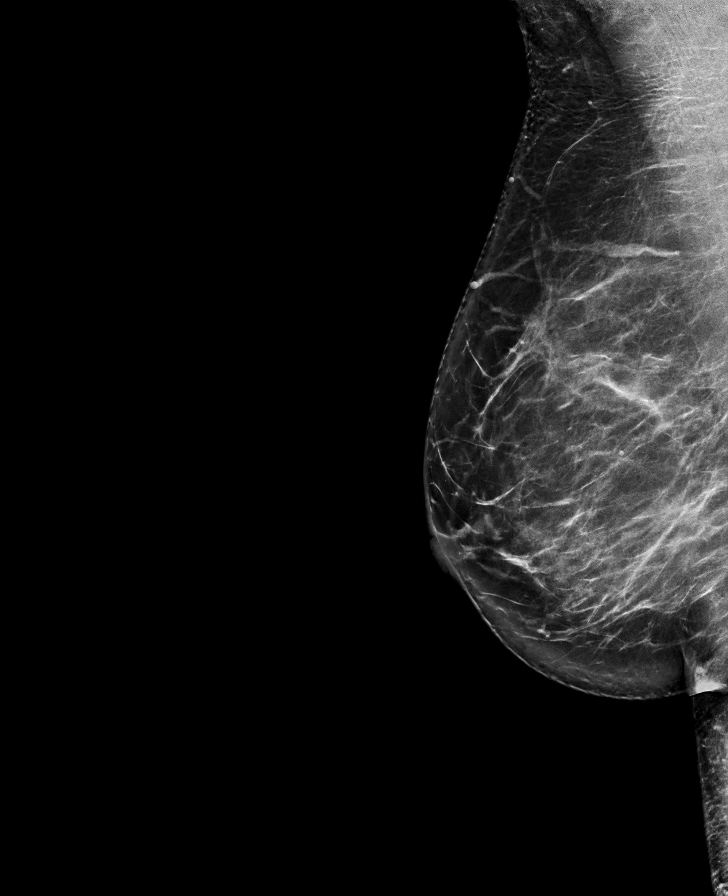

[L MLO synth-2D]
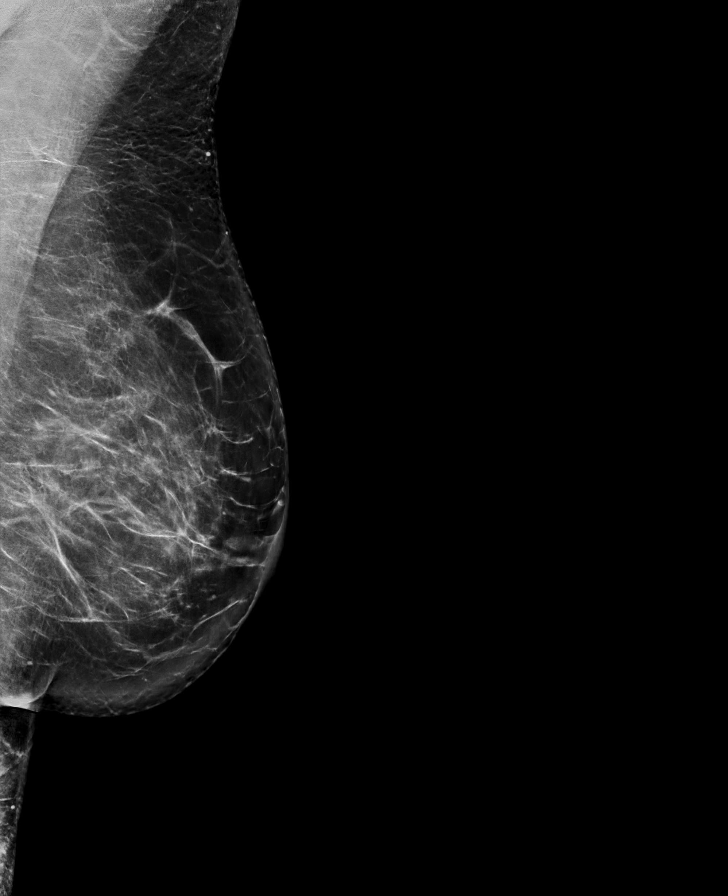

[R CC synth-2D]
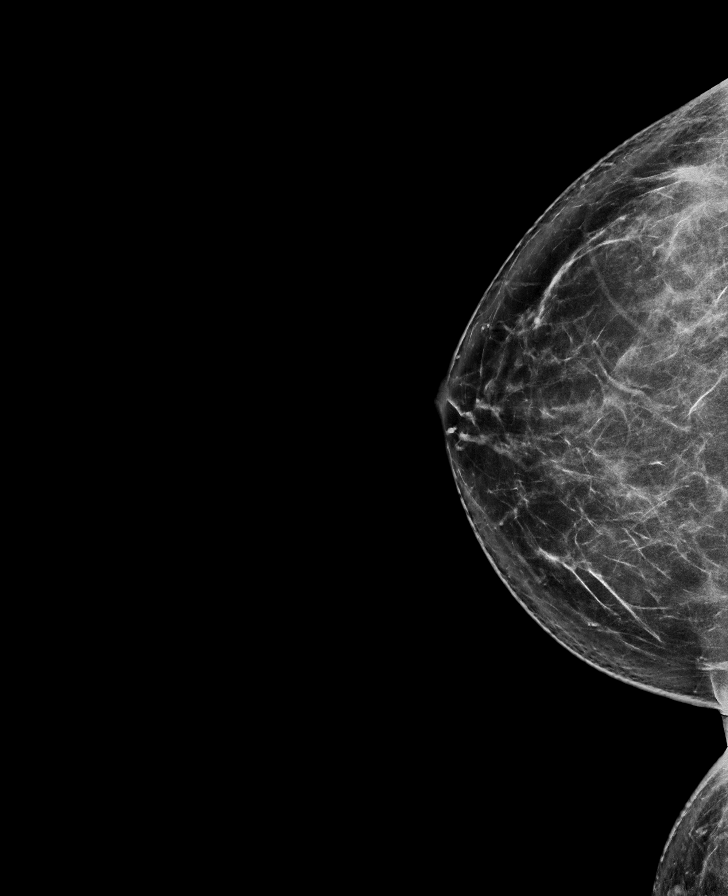

[L CC synth-2D]
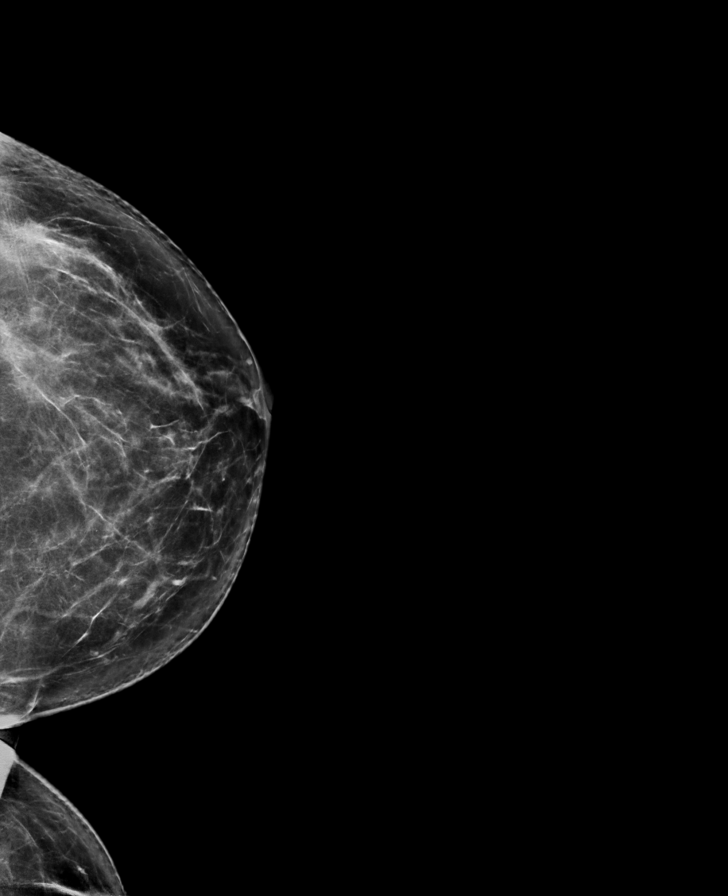

[R MLO tomo · tomo slice 47/93.0]
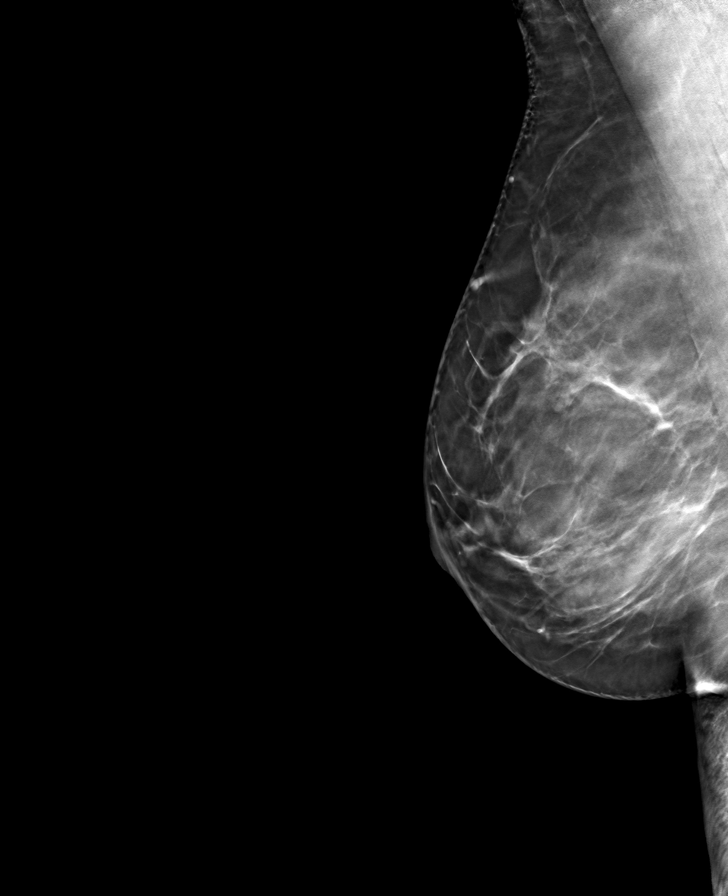

[L MLO tomo · tomo slice 49/96.0]
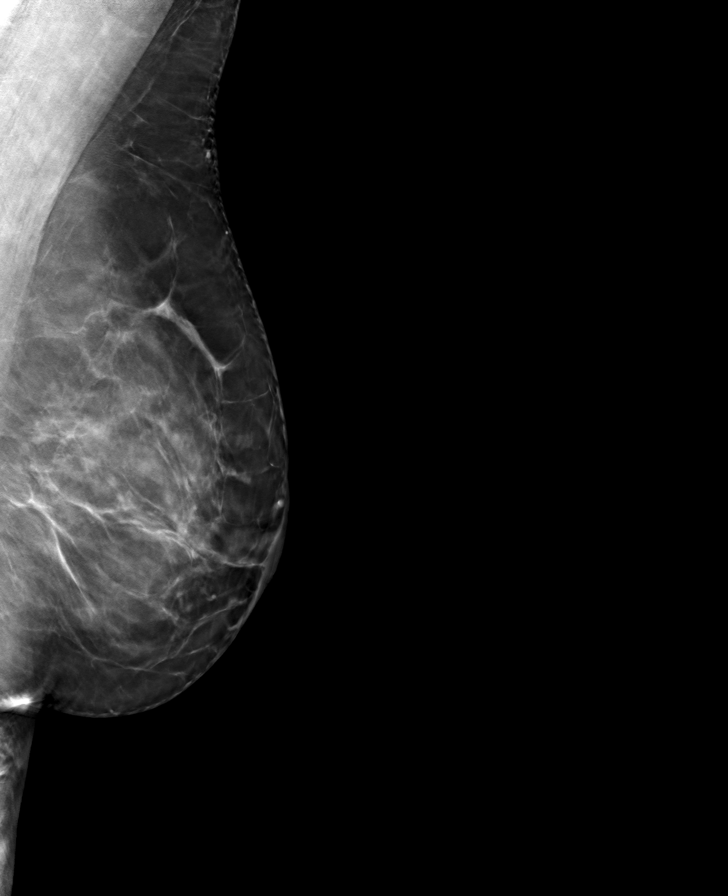

[R CC tomo · tomo slice 43/85.0]
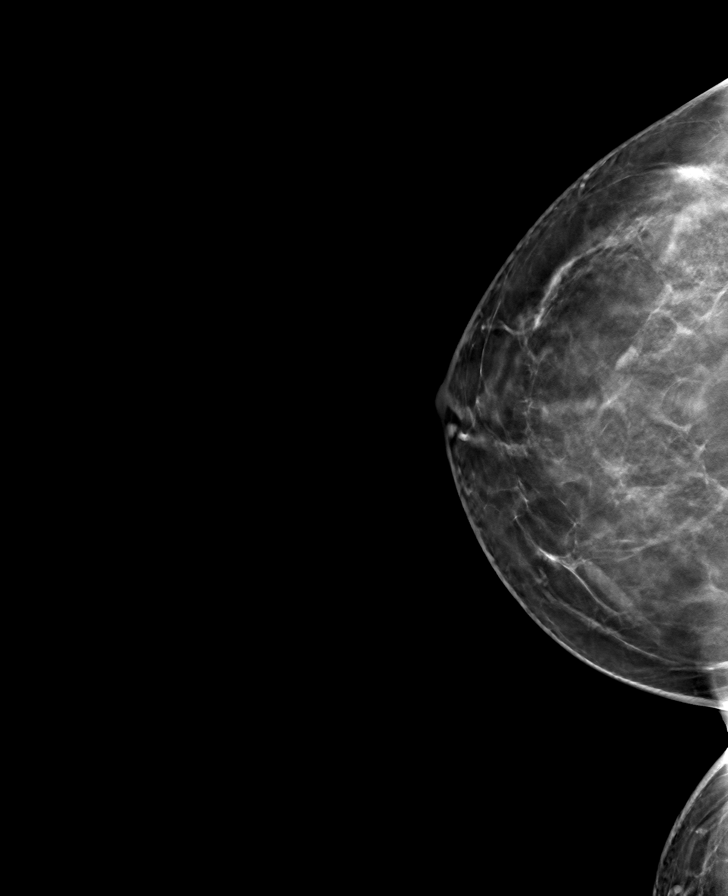

[L CC tomo · tomo slice 41/81.0]
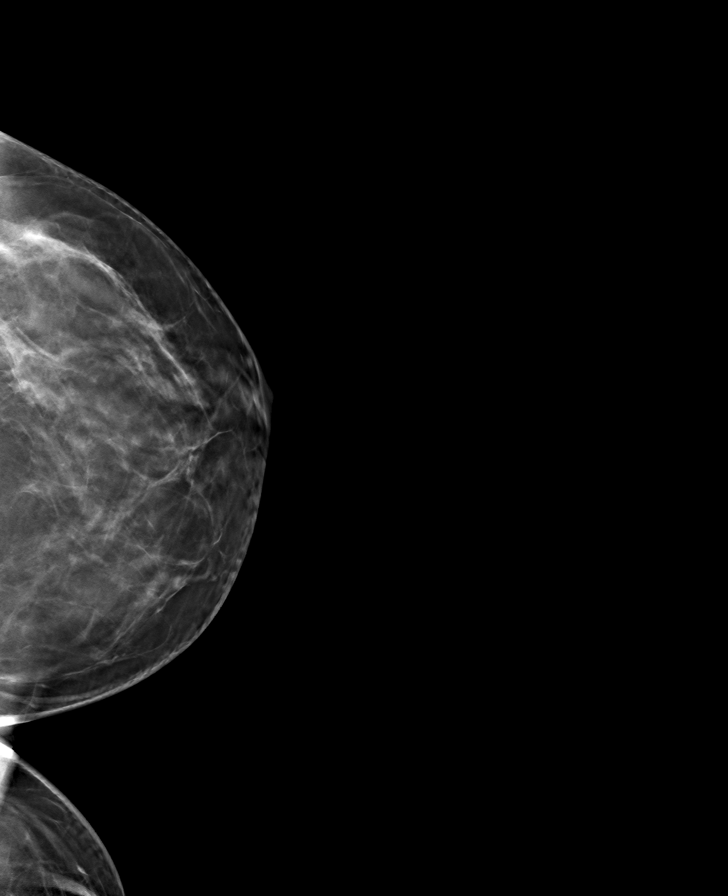

[8 of 24 positions shown; findings below may reference images not displayed]

ACR Breast Density Category c: The breast tissue is heterogeneously
dense, which may obscure small masses.
FINDINGS: There are no findings suspicious for malignancy.
IMPRESSION: No mammographic evidence of malignancy. A result letter of this
screening mammogram will be mailed directly to the patient.

RECOMMENDATION:
Screening mammogram in one year. (Code:Q3-W-BC3)

BI-RADS CATEGORY  1: Negative.

## 2022-08-21 ENCOUNTER — Other Ambulatory Visit: Payer: Self-pay

## 2022-08-21 ENCOUNTER — Emergency Department: Payer: 59

## 2022-08-21 ENCOUNTER — Emergency Department
Admission: EM | Admit: 2022-08-21 | Discharge: 2022-08-21 | Disposition: A | Discharge status: Home / Self Care | Payer: 59 | Attending: Emergency Medicine | Admitting: Emergency Medicine

## 2022-08-21 DIAGNOSIS — M25532 Pain in left wrist: Secondary | ICD-10-CM | POA: Diagnosis not present

## 2022-08-21 DIAGNOSIS — S52522A Torus fracture of lower end of left radius, initial encounter for closed fracture: Secondary | ICD-10-CM | POA: Diagnosis not present

## 2022-08-21 DIAGNOSIS — S52502A Unspecified fracture of the lower end of left radius, initial encounter for closed fracture: Secondary | ICD-10-CM | POA: Insufficient documentation

## 2022-08-21 DIAGNOSIS — S6992XA Unspecified injury of left wrist, hand and finger(s), initial encounter: Secondary | ICD-10-CM | POA: Diagnosis not present

## 2022-08-21 DIAGNOSIS — S52612A Displaced fracture of left ulna styloid process, initial encounter for closed fracture: Secondary | ICD-10-CM | POA: Diagnosis not present

## 2022-08-21 DIAGNOSIS — S59292A Other physeal fracture of lower end of radius, left arm, initial encounter for closed fracture: Secondary | ICD-10-CM | POA: Diagnosis not present

## 2022-08-21 MED ORDER — ONDANSETRON 4 MG PO TBDP
4.0000 mg | ORAL_TABLET | Freq: Once | ORAL | Status: AC
  Administered 2022-08-21: 4 mg via ORAL
  Filled 2022-08-21: qty 1

## 2022-08-21 MED ORDER — OXYCODONE-ACETAMINOPHEN 5-325 MG PO TABS
1.0000 | ORAL_TABLET | Freq: Once | ORAL | Status: AC
  Administered 2022-08-21: 1 via ORAL
  Filled 2022-08-21: qty 1

## 2022-08-21 MED ORDER — ONDANSETRON 4 MG PO TBDP: 4.0000 mg | ORAL_TABLET | Freq: Three times a day (TID) | ORAL | 0 refills | Status: AC | PRN

## 2022-08-21 MED ORDER — LIDOCAINE HCL 1 % IJ SOLN
10.0000 mL | Freq: Once | INTRAMUSCULAR | Status: AC
  Administered 2022-08-21: 10 mL
  Filled 2022-08-21: qty 10

## 2022-08-21 MED ORDER — OXYCODONE-ACETAMINOPHEN 5-325 MG PO TABS: 1.0000 | ORAL_TABLET | Freq: Four times a day (QID) | ORAL | 0 refills | Status: DC | PRN

## 2022-08-21 MED ORDER — BUPIVACAINE HCL (PF) 0.5 % IJ SOLN
10.0000 mL | Freq: Once | INTRAMUSCULAR | Status: AC
  Administered 2022-08-21: 10 mL
  Filled 2022-08-21: qty 10

## 2022-08-21 MED ORDER — HYDROMORPHONE HCL 1 MG/ML IJ SOLN: 1.0000 mg | Freq: Once | INTRAMUSCULAR | Status: DC

## 2022-08-21 NOTE — ED Provider Triage Note (Signed)
Emergency Medicine Provider Triage Evaluation Note  Dana Patel , a 51 y.o. female  was evaluated in triage.  Pt complains of wrist injury after falling off horse. Patient fell onto wrist. Wearing helmet and did not hit head. Deformity to wrist indentified.  Review of Systems  Positive: Wrist injury/pain Negative: HA, neck pain, spine pain, hip pain  Physical Exam  BP 133/71 (BP Location: Left Arm)   Pulse 79   Temp 98.6 F (37 C) (Oral)   Resp 18   Wt 79.4 kg   SpO2 99%   BMI 24.41 kg/m  Gen:   Awake, no distress   Resp:  Normal effort  MSK:   Moves extremities. Obvious deformity to L wrist Other:    Medical Decision Making  Medically screening exam initiated at 3:38 PM.  Appropriate orders placed.  Dana Patel was informed that the remainder of the evaluation will be completed by another provider, this initial triage assessment does not replace that evaluation, and the importance of remaining in the ED until their evaluation is complete.  Fall from horse. L wrist injury. Xray and pain meds ordered. Patient declines imaging of head/neck as she did not hit head.   Racheal Patches, PA-C 08/21/22 1541

## 2022-08-21 NOTE — ED Triage Notes (Signed)
Pt sts that she fell off her 17 hand horse. Pt has a left wrist deformity. Ice placed on pt arm.

## 2022-08-21 NOTE — ED Provider Notes (Signed)
[facility] Provider Note  Patient Contact: 6:46 PM (approximate)   History   Wrist Pain   HPI  Dana Patel is a 51 y.o. female presents to the emergency department with acute left wrist pain after falling from a horse earlier today.  Patient has a deformity.  No similar injuries in the past.  She did not hit her head or neck.  Patient states that she does have some tingling along the dorsal aspect of the left wrist.      Physical Exam   Triage Vital Signs: ED Triage Vitals  Enc Vitals Group     BP 08/21/22 1536 133/71     Pulse Rate 08/21/22 1536 79     Resp 08/21/22 1536 18     Temp 08/21/22 1536 98.6 F (37 C)     Temp Source 08/21/22 1536 Oral     SpO2 08/21/22 1536 99 %     Weight 08/21/22 1537 175 lb (79.4 kg)     Height 08/21/22 1733 5\' 11"  (1.803 m)     Head Circumference --      Peak Flow --      Pain Score 08/21/22 1537 8     Pain Loc --      Pain Edu? --      Excl. in [place]? --     Most recent vital signs: Vitals:   08/21/22 1536 08/21/22 1841  BP: 133/71 130/70  Pulse: 79 80  Resp: 18 18  Temp: 98.6 F (37 C)   SpO2: 99% 99%     General: Alert and in no acute distress. Eyes:  PERRL. EOMI. Head: No acute traumatic findings ENT:      Nose: No congestion/rhinnorhea.      Mouth/Throat: Mucous membranes are moist. Neck: No stridor. No cervical spine tenderness to palpation. Cardiovascular:  Good peripheral perfusion Respiratory: Normal respiratory effort without tachypnea or retractions. Lungs CTAB. Good air entry to the bases with no decreased or absent breath sounds. Gastrointestinal: Bowel sounds 4 quadrants. Soft and nontender to palpation. No guarding or rigidity. No palpable masses. No distention. No CVA tenderness. Musculoskeletal: Patient performs limited range of motion of the left wrist.  Patient has deformity.  Palpable radial and ulnar pulses, left.  Capillary refill less than 2 seconds on the left. Neurologic:   No gross focal neurologic deficits are appreciated.  Skin:   No rash noted Other:   ED Results / Procedures / Treatments   Labs (all labs ordered are listed, but only abnormal results are displayed) Labs Reviewed - No data to display      RADIOLOGY  I personally viewed and evaluated these images as part of my medical decision making, as well as reviewing the written report by the radiologist.  ED Provider Interpretation: Transverse distal radius fracture   PROCEDURES:  Critical Care performed: No  Reduction of fracture  Date/Time: 08/21/2022 6:55 PM  Performed by: Lannie Fields, PA-C Authorized by: Lannie Fields, PA-C  Consent: Verbal consent obtained. Consent given by: patient Patient understanding: patient states understanding of the procedure being performed Patient identity confirmed: verbally with patient Local anesthesia used: yes Anesthesia: hematoma block  Anesthesia: Local anesthesia used: yes Anesthetic total: 10 mL  Sedation: Patient sedated: no  Comments: Reduction attempted with traction.       MEDICATIONS ORDERED IN ED: Medications  oxyCODONE-acetaminophen (PERCOCET/ROXICET) 5-325 MG per tablet 1 tablet (1 tablet Oral Given 08/21/22 1542)  ondansetron (ZOFRAN-ODT) disintegrating tablet 4  mg (4 mg Oral Given 08/21/22 1719)  lidocaine (XYLOCAINE) 1 % (with pres) injection 10 mL (10 mLs Infiltration Given 08/21/22 1804)  bupivacaine(PF) (MARCAINE) 0.5 % injection 10 mL (10 mLs Infiltration Given by Other 08/21/22 1804)  oxyCODONE-acetaminophen (PERCOCET/ROXICET) 5-325 MG per tablet 1 tablet (1 tablet Oral Given 08/21/22 1839)  ondansetron (ZOFRAN-ODT) disintegrating tablet 4 mg (4 mg Oral Given 08/21/22 1839)     IMPRESSION / MDM / ASSESSMENT AND PLAN / ED COURSE  I reviewed the triage vital signs and the nursing notes.                              Assessment and plan:  Distal radius fracture 51 year old female presents to the  emergency department with acute left wrist pain after falling from her horse.  Vital signs were reassuring at triage.  On exam, patient was alert, active and nontoxic-appearing.  She did have a deformity along the dorsal aspect of the left wrist.  Initial x-ray of the left wrist was concerning for a transverse fracture of the distal radius with posterior angulation.  Patient received hematoma block in the emergency department with attempted reduction which had some improvement in deformity and interval improvement on postreduction film.  I reviewed patient's x-rays with orthopedics, Dr. Odis Luster who stated that patient had a tract fracture fragment and would ultimately require surgery.  He recommended sugar-tong splint.  Patient was given sling for comfort and was prescribed Percocet for pain      FINAL CLINICAL IMPRESSION(S) / ED DIAGNOSES   Final diagnoses:  Left wrist pain     Rx / DC Orders   ED Discharge Orders          Ordered    oxyCODONE-acetaminophen (PERCOCET/ROXICET) 5-325 MG tablet  Every 6 hours PRN        08/21/22 1830    ondansetron (ZOFRAN-ODT) 4 MG disintegrating tablet  Every 8 hours PRN        08/21/22 1830             Note:  This document was prepared using Dragon voice recognition software and may include unintentional dictation errors.   [name], PA-C 08/21/22 1900    [name], MD 08/21/22 [phone]

## 2022-08-22 ENCOUNTER — Other Ambulatory Visit: Payer: Self-pay | Admitting: Orthopedic Surgery

## 2022-08-23 ENCOUNTER — Encounter
Admission: RE | Admit: 2022-08-23 | Discharge: 2022-08-23 | Disposition: A | Discharge status: Home / Self Care | Payer: 59 | Source: Ambulatory Visit | Attending: Orthopedic Surgery | Admitting: Orthopedic Surgery

## 2022-08-23 DIAGNOSIS — S52532A Colles' fracture of left radius, initial encounter for closed fracture: Secondary | ICD-10-CM | POA: Diagnosis not present

## 2022-08-23 NOTE — Patient Instructions (Addendum)
Your procedure is scheduled on: Thursday August 24, 2022. Report to Day Surgery inside [facility] 2nd floor, stop by registration desk before getting on elevator.  To find out your arrival time please call [phone] between 1PM - 3PM on Wednesday August 23, 2022.  Remember: Instructions that are not followed completely may result in serious medical risk,  up to and including death, or upon the discretion of your surgeon and anesthesiologist your  surgery may need to be rescheduled.     _X__ 1. Do not eat food or drink fluids after midnight the night before your procedure.                 No chewing gum or hard candies.   __X__2.  On the morning of surgery brush your teeth with toothpaste and water, you                may rinse your mouth with mouthwash if you wish.  Do not swallow any toothpaste or mouthwash.     _X__ 3.  No Alcohol for 24 hours before or after surgery.   _X__ 4.  Do Not Smoke or use e-cigarettes For 24 Hours Prior to Your Surgery.                 Do not use any chewable tobacco products for at least 6 hours prior to                 Surgery.  _X__  5.  Do not use any recreational drugs (marijuana, cocaine, heroin, ecstasy, MDMA or other)                For at least one week prior to your surgery.  Combination of these drugs with anesthesia                May have life threatening results.  ____  6.  Bring all medications with you on the day of surgery if instructed.   __X__  7.  Notify your doctor if there is any change in your medical condition      (cold, fever, infections).     Do not wear jewelry, make-up, hairpins, clips or nail polish. Do not wear lotions, powders, or perfumes. You may wear deodorant. Do not shave 48 hours prior to surgery. Men may shave face and neck. Do not bring valuables to the hospital.    [facility] is not responsible for any belongings or valuables.  Contacts, dentures or bridgework may not be worn  into surgery. Leave your suitcase in the car. After surgery it may be brought to your room. For patients admitted to the hospital, discharge time is determined by your treatment team.   Patients discharged the day of surgery will not be allowed to drive home.   Make arrangements for someone to be with you for the first 24 hours of your Same Day Discharge.   __X__ Take these medicines the morning of surgery with A SIP OF WATER:    1. oxyCODONE-acetaminophen (PERCOCET/ROXICET) 5-325 MG (if needed)  2.   3.   4.  5.  6.  ____ Fleet Enema (as directed)   __X__ Use CHG Soap (or wipes) as directed  ____ Use Benzoyl Peroxide Gel as instructed  ____ Use inhalers on the day of surgery  ____ Stop metformin 2 days prior to surgery    ____ Take 1/2 of usual insulin dose the night before surgery. No insulin the morning  of surgery.   ____ Call your PCP, cardiologist, or Pulmonologist if taking Coumadin/Plavix/aspirin and ask when to stop before your surgery.   __X__ One Week prior to surgery- Stop Anti-inflammatories such as Ibuprofen, Aleve, Advil, Motrin, meloxicam (MOBIC), diclofenac, etodolac, ketorolac, Toradol, Daypro, piroxicam, Goody's or BC powders. OK TO USE TYLENOL IF NEEDED   __X__ Do no start any new vitamins and or supplements until after surgery.    ____ Bring C-Pap to the hospital.    If you have any questions regarding your pre-procedure instructions,  Please call Pre-admit Testing at [phone]

## 2022-08-24 ENCOUNTER — Ambulatory Visit: Payer: 59

## 2022-08-24 ENCOUNTER — Other Ambulatory Visit: Payer: Self-pay

## 2022-08-24 ENCOUNTER — Encounter: Payer: Self-pay | Admitting: Orthopedic Surgery

## 2022-08-24 ENCOUNTER — Ambulatory Visit: Payer: 59 | Admitting: Anesthesiology

## 2022-08-24 ENCOUNTER — Encounter
Admission: RE | Disposition: A | Discharge status: Home / Self Care | Payer: Self-pay | Source: Home / Self Care | Attending: Orthopedic Surgery

## 2022-08-24 ENCOUNTER — Ambulatory Visit
Admission: RE | Admit: 2022-08-24 | Discharge: 2022-08-24 | Disposition: A | Discharge status: Home / Self Care | Payer: 59 | Attending: Orthopedic Surgery | Admitting: Orthopedic Surgery

## 2022-08-24 DIAGNOSIS — R69 Illness, unspecified: Secondary | ICD-10-CM | POA: Diagnosis not present

## 2022-08-24 DIAGNOSIS — F419 Anxiety disorder, unspecified: Secondary | ICD-10-CM | POA: Diagnosis not present

## 2022-08-24 DIAGNOSIS — S52572A Other intraarticular fracture of lower end of left radius, initial encounter for closed fracture: Secondary | ICD-10-CM | POA: Insufficient documentation

## 2022-08-24 DIAGNOSIS — S52592A Other fractures of lower end of left radius, initial encounter for closed fracture: Secondary | ICD-10-CM | POA: Diagnosis not present

## 2022-08-24 DIAGNOSIS — G8918 Other acute postprocedural pain: Secondary | ICD-10-CM | POA: Diagnosis not present

## 2022-08-24 DIAGNOSIS — S52502A Unspecified fracture of the lower end of left radius, initial encounter for closed fracture: Secondary | ICD-10-CM | POA: Diagnosis not present

## 2022-08-24 HISTORY — PX: OPEN REDUCTION INTERNAL FIXATION (ORIF) DISTAL RADIAL FRACTURE: SHX5989

## 2022-08-24 SURGERY — OPEN REDUCTION INTERNAL FIXATION (ORIF) DISTAL RADIUS FRACTURE
Anesthesia: General | Site: Wrist | Laterality: Left

## 2022-08-24 MED ORDER — NEOMYCIN-POLYMYXIN B GU 40-200000 IR SOLN
Status: AC
  Filled 2022-08-24: qty 20

## 2022-08-24 MED ORDER — ROPIVACAINE HCL 5 MG/ML IJ SOLN
INTRAMUSCULAR | Status: DC | PRN
  Administered 2022-08-24: 30 mL via PERINEURAL

## 2022-08-24 MED ORDER — ACETAMINOPHEN 500 MG PO TABS: 1000.0000 mg | ORAL_TABLET | ORAL | Status: AC

## 2022-08-24 MED ORDER — ROPIVACAINE HCL 5 MG/ML IJ SOLN
INTRAMUSCULAR | Status: AC
  Filled 2022-08-24: qty 30

## 2022-08-24 MED ORDER — FENTANYL CITRATE (PF) 100 MCG/2ML IJ SOLN
INTRAMUSCULAR | Status: DC | PRN
  Administered 2022-08-24 (×4): 25 ug via INTRAVENOUS

## 2022-08-24 MED ORDER — ORAL CARE MOUTH RINSE: 15.0000 mL | Freq: Once | OROMUCOSAL | Status: AC

## 2022-08-24 MED ORDER — CHLORHEXIDINE GLUCONATE 0.12 % MT SOLN
OROMUCOSAL | Status: AC
  Administered 2022-08-24: 15 mL via OROMUCOSAL
  Filled 2022-08-24: qty 15

## 2022-08-24 MED ORDER — FAMOTIDINE 20 MG PO TABS: 20.0000 mg | ORAL_TABLET | Freq: Once | ORAL | Status: AC

## 2022-08-24 MED ORDER — DEXAMETHASONE SODIUM PHOSPHATE 10 MG/ML IJ SOLN
INTRAMUSCULAR | Status: DC | PRN
  Administered 2022-08-24: 10 mg via INTRAVENOUS

## 2022-08-24 MED ORDER — CHLORHEXIDINE GLUCONATE CLOTH 2 % EX PADS
6.0000 | MEDICATED_PAD | Freq: Once | CUTANEOUS | Status: AC
  Administered 2022-08-24: 6 via TOPICAL

## 2022-08-24 MED ORDER — ACETAMINOPHEN 500 MG PO TABS
ORAL_TABLET | ORAL | Status: AC
  Administered 2022-08-24: 1000 mg via ORAL
  Filled 2022-08-24: qty 2

## 2022-08-24 MED ORDER — CHLORHEXIDINE GLUCONATE 0.12 % MT SOLN: 15.0000 mL | Freq: Once | OROMUCOSAL | Status: AC

## 2022-08-24 MED ORDER — MIDAZOLAM HCL 2 MG/2ML IJ SOLN
INTRAMUSCULAR | Status: DC | PRN
  Administered 2022-08-24: 2 mg via INTRAVENOUS

## 2022-08-24 MED ORDER — FAMOTIDINE 20 MG PO TABS
ORAL_TABLET | ORAL | Status: AC
  Administered 2022-08-24: 20 mg via ORAL
  Filled 2022-08-24: qty 1

## 2022-08-24 MED ORDER — LIDOCAINE HCL (CARDIAC) PF 50 MG/5ML IV SOSY
3.0000 mL | PREFILLED_SYRINGE | Freq: Once | INTRAVENOUS | Status: DC
  Filled 2022-08-24: qty 3

## 2022-08-24 MED ORDER — CEFAZOLIN SODIUM-DEXTROSE 2-4 GM/100ML-% IV SOLN
INTRAVENOUS | Status: AC
  Filled 2022-08-24: qty 100

## 2022-08-24 MED ORDER — FENTANYL CITRATE (PF) 100 MCG/2ML IJ SOLN
25.0000 ug | INTRAMUSCULAR | Status: DC | PRN
  Administered 2022-08-24 (×3): 25 ug via INTRAVENOUS

## 2022-08-24 MED ORDER — DEXMEDETOMIDINE HCL IN NACL 80 MCG/20ML IV SOLN
INTRAVENOUS | Status: DC | PRN
  Administered 2022-08-24: 8 ug via BUCCAL

## 2022-08-24 MED ORDER — ROPIVACAINE HCL 5 MG/ML IJ SOLN: 30.0000 mL | Freq: Once | INTRAMUSCULAR | Status: DC

## 2022-08-24 MED ORDER — LACTATED RINGERS IV SOLN: INTRAVENOUS | Status: DC

## 2022-08-24 MED ORDER — DEXMEDETOMIDINE HCL IN NACL 80 MCG/20ML IV SOLN
INTRAVENOUS | Status: AC
  Filled 2022-08-24: qty 20

## 2022-08-24 MED ORDER — OXYCODONE HCL 5 MG PO TABS: 5.0000 mg | ORAL_TABLET | ORAL | 0 refills | Status: DC | PRN

## 2022-08-24 MED ORDER — PHENYLEPHRINE 80 MCG/ML (10ML) SYRINGE FOR IV PUSH (FOR BLOOD PRESSURE SUPPORT)
PREFILLED_SYRINGE | INTRAVENOUS | Status: DC | PRN
  Administered 2022-08-24: 80 ug via INTRAVENOUS

## 2022-08-24 MED ORDER — LIDOCAINE HCL (PF) 1 % IJ SOLN
INTRAMUSCULAR | Status: DC | PRN
  Administered 2022-08-24: 5 mL via SUBCUTANEOUS

## 2022-08-24 MED ORDER — MIDAZOLAM HCL 2 MG/2ML IJ SOLN
INTRAMUSCULAR | Status: AC
  Filled 2022-08-24: qty 2

## 2022-08-24 MED ORDER — ONDANSETRON HCL 4 MG/2ML IJ SOLN: 4.0000 mg | Freq: Once | INTRAMUSCULAR | Status: DC | PRN

## 2022-08-24 MED ORDER — FENTANYL CITRATE (PF) 100 MCG/2ML IJ SOLN
INTRAMUSCULAR | Status: AC
  Administered 2022-08-24: 25 ug via INTRAVENOUS
  Filled 2022-08-24: qty 2

## 2022-08-24 MED ORDER — 0.9 % SODIUM CHLORIDE (POUR BTL) OPTIME
TOPICAL | Status: DC | PRN
  Administered 2022-08-24: 500 mL

## 2022-08-24 MED ORDER — BUPIVACAINE HCL (PF) 0.5 % IJ SOLN
INTRAMUSCULAR | Status: AC
  Filled 2022-08-24: qty 30

## 2022-08-24 MED ORDER — LIDOCAINE HCL (CARDIAC) PF 100 MG/5ML IV SOSY
PREFILLED_SYRINGE | INTRAVENOUS | Status: DC | PRN
  Administered 2022-08-24: 100 mg via INTRAVENOUS

## 2022-08-24 MED ORDER — CEFAZOLIN SODIUM-DEXTROSE 2-4 GM/100ML-% IV SOLN
2.0000 g | INTRAVENOUS | Status: AC
  Administered 2022-08-24: 2 g via INTRAVENOUS

## 2022-08-24 MED ORDER — HYDROMORPHONE HCL 1 MG/ML IJ SOLN
INTRAMUSCULAR | Status: AC
  Filled 2022-08-24: qty 1

## 2022-08-24 MED ORDER — LIDOCAINE HCL (PF) 1 % IJ SOLN
INTRAMUSCULAR | Status: AC
  Filled 2022-08-24: qty 5

## 2022-08-24 MED ORDER — HYDROMORPHONE HCL 1 MG/ML IJ SOLN
INTRAMUSCULAR | Status: DC | PRN
  Administered 2022-08-24 (×2): .5 mg via INTRAVENOUS

## 2022-08-24 MED ORDER — PROPOFOL 10 MG/ML IV BOLUS
INTRAVENOUS | Status: DC | PRN
  Administered 2022-08-24: 150 mg via INTRAVENOUS

## 2022-08-24 MED ORDER — FENTANYL CITRATE (PF) 100 MCG/2ML IJ SOLN
INTRAMUSCULAR | Status: AC
  Filled 2022-08-24: qty 2

## 2022-08-24 SURGICAL SUPPLY — 64 items
BIT DRILL 2 FAST STEP (BIT) IMPLANT
BIT DRILL 2.5X4 QC (BIT) IMPLANT
BNDG COHESIVE 4X5 TAN STRL LF (GAUZE/BANDAGES/DRESSINGS) ×1 IMPLANT
BNDG ELASTIC 2X5.8 VLCR STR LF (GAUZE/BANDAGES/DRESSINGS) IMPLANT
BNDG ELASTIC 3X5.8 VLCR NS LF (GAUZE/BANDAGES/DRESSINGS) ×2 IMPLANT
BNDG ELASTIC 4X5.8 VLCR NS LF (GAUZE/BANDAGES/DRESSINGS) ×1 IMPLANT
BNDG ELASTIC 4X5.8 VLCR STR LF (GAUZE/BANDAGES/DRESSINGS) ×2 IMPLANT
BNDG ESMARK 4X12 TAN STRL LF (GAUZE/BANDAGES/DRESSINGS) ×1 IMPLANT
BNDG PLASTER FAST 3X3 WHT LF (CAST SUPPLIES) ×2 IMPLANT
CORD BIP STRL DISP 12FT (MISCELLANEOUS) ×1 IMPLANT
CUFF TOURN SGL QUICK 18X4 (TOURNIQUET CUFF) IMPLANT
DRAPE FLUOR MINI C-ARM 54X84 (DRAPES) ×1 IMPLANT
DRAPE SURG 17X11 SM STRL (DRAPES) ×1 IMPLANT
DRSG GAUZE FLUFF 36X18 (GAUZE/BANDAGES/DRESSINGS) ×1 IMPLANT
DURAPREP 26ML APPLICATOR (WOUND CARE) ×1 IMPLANT
ELECT REM PT RETURN 9FT ADLT (ELECTROSURGICAL) ×1
ELECTRODE REM PT RTRN 9FT ADLT (ELECTROSURGICAL) ×1 IMPLANT
FORCEPS JEWEL BIP 4-3/4 STR (INSTRUMENTS) ×1 IMPLANT
GAUZE SPONGE 4X4 12PLY STRL (GAUZE/BANDAGES/DRESSINGS) ×1 IMPLANT
GAUZE XEROFORM 1X8 LF (GAUZE/BANDAGES/DRESSINGS) ×1 IMPLANT
GLOVE BIO SURGEON STRL SZ7.5 (GLOVE) ×1 IMPLANT
GLOVE BIOGEL PI IND STRL 9 (GLOVE) ×1 IMPLANT
GLOVE BIOGEL PI ORTHO SZ9 (GLOVE) ×2 IMPLANT
GLOVE SURG UNDER LTX SZ7.5 (GLOVE) ×1 IMPLANT
GOWN STRL REUS TWL 2XL XL LVL4 (GOWN DISPOSABLE) ×1 IMPLANT
GOWN STRL REUS W/ TWL LRG LVL3 (GOWN DISPOSABLE) ×1 IMPLANT
GOWN STRL REUS W/TWL LRG LVL3 (GOWN DISPOSABLE) ×1
K-WIRE 1.6 (WIRE) ×3
K-WIRE FX5X1.6XNS BN SS (WIRE) ×3
KIT TURNOVER KIT A (KITS) ×1 IMPLANT
KWIRE FX5X1.6XNS BN SS (WIRE) IMPLANT
MANIFOLD NEPTUNE II (INSTRUMENTS) ×1 IMPLANT
NDL FILTER BLUNT 18X1 1/2 (NEEDLE) ×1 IMPLANT
NEEDLE FILTER BLUNT 18X1 1/2 (NEEDLE) ×1 IMPLANT
NS IRRIG 500ML POUR BTL (IV SOLUTION) ×1 IMPLANT
ORTHO-GLASS FIBERGLASS PADDED SPLINT Lawson 106234 IMPLANT
PACK EXTREMITY ARMC (MISCELLANEOUS) ×1 IMPLANT
PAD ABD DERMACEA PRESS 5X9 (GAUZE/BANDAGES/DRESSINGS) IMPLANT
PAD CAST 4YDX4 CTTN HI CHSV (CAST SUPPLIES) ×2 IMPLANT
PAD PREP 24X41 OB/GYN DISP (PERSONAL CARE ITEMS) ×1 IMPLANT
PADDING CAST COTTON 2X4 ST (MISCELLANEOUS) IMPLANT
PADDING CAST COTTON 4X4 STRL (CAST SUPPLIES) ×2
PEG FULLY THREADED 2.5X22MM (Peg) IMPLANT
PLATE SHORT 24.4X51.3 LT (Plate) IMPLANT
SCREW BN 12X3.5XNS CORT TI (Screw) IMPLANT
SCREW CORT 3.5X12 (Screw) ×3 IMPLANT
SCREW PEG LOCK 2.5X20 (Peg) IMPLANT
SLING ARM M TX990204 (SOFTGOODS) IMPLANT
SPLINT CAST 1 STEP 3X12 (MISCELLANEOUS) IMPLANT
SPLINT CAST 1 STEP 4X30 (MISCELLANEOUS) ×1 IMPLANT
STOCKINETTE 48X4 2 PLY STRL (GAUZE/BANDAGES/DRESSINGS) ×1 IMPLANT
STOCKINETTE STRL 4IN 9604848 (GAUZE/BANDAGES/DRESSINGS) ×1 IMPLANT
STRIP CLOSURE SKIN 1/2X4 (GAUZE/BANDAGES/DRESSINGS) ×1 IMPLANT
SUT ETHILON 4-0 (SUTURE) ×1
SUT ETHILON 4-0 FS2 18XMFL BLK (SUTURE) ×1
SUT MNCRL AB 4-0 PS2 18 (SUTURE) ×1 IMPLANT
SUT VIC AB 0 CT2 27 (SUTURE) ×1 IMPLANT
SUT VIC AB 3-0 SH 27 (SUTURE) ×2
SUT VIC AB 3-0 SH 27X BRD (SUTURE) ×1 IMPLANT
SUTURE ETHLN 4-0 FS2 18XMF BLK (SUTURE) IMPLANT
SYR 10ML LL (SYRINGE) ×1 IMPLANT
TAPE TRANSPORE STRL 2 31045 (GAUZE/BANDAGES/DRESSINGS) ×1 IMPLANT
TRAP FLUID SMOKE EVACUATOR (MISCELLANEOUS) ×1 IMPLANT
WATER STERILE IRR 500ML POUR (IV SOLUTION) ×1 IMPLANT

## 2022-08-24 NOTE — Transfer of Care (Signed)
Immediate Anesthesia Transfer of Care Note  Patient: Dana Patel  Procedure(s) Performed: OPEN REDUCTION INTERNAL FIXATION (ORIF) DISTAL RADIUS FRACTURE (Left: Wrist)  Patient Location: PACU  Anesthesia Type:General  Level of Consciousness: awake and patient cooperative  Airway & Oxygen Therapy: Patient Spontanous Breathing  Post-op Assessment: Report given to RN and Post -op Vital signs reviewed and stable  Post vital signs: Reviewed and stable   Last Vitals:  Vitals Value Taken Time  BP 126/64 08/24/22 1412  Temp    Pulse 103 08/24/22 1412  Resp 14 08/24/22 1412  SpO2 95 % 08/24/22 1412  Vitals shown include unvalidated device data.  Last Pain:  Vitals:   08/24/22 1015  TempSrc: Temporal  PainSc: 0-No pain         Complications: No notable events documented.

## 2022-08-24 NOTE — Discharge Instructions (Addendum)
AMBULATORY SURGERY  DISCHARGE INSTRUCTIONS   The drugs that you were given will stay in your system until tomorrow so for the next 24 hours you should not:  Drive an automobile Make any legal decisions Drink any alcoholic beverage   You may resume regular meals tomorrow.  Today it is better to start with liquids and gradually work up to solid foods.  You may eat anything you prefer, but it is better to start with liquids, then soup and crackers, and gradually work up to solid foods.   Please notify your doctor immediately if you have any unusual bleeding, trouble breathing, redness and pain at the surgery site, drainage, fever, or pain not relieved by medication.    Additional Instructions:        Please contact your physician with any problems or Same Day Surgery at [phone], Monday through Friday 6 am to 4 pm, or [place] at [place] Main number at [phone].    Peripheral Nerve Block, Upper Extremity (PNBUE) Discharge Instructions    For your surgery you have received a Peripheral Nerve Block. Nerve Blocks affect many types of nerves, including nerves that control movement, pain and normal sensation.  You may experience feelings such as numbness, tingling, heaviness, weakness or the inability to move your arm or the feeling or sensation that your arm has "fallen asleep". A nerve block can last for 2 - 36 hours or more depending on the medication used.  Usually the weakness wears off first.  The tingling and heaviness usually wear off next.  Finally you may start to notice pain.  Keep in mind that this may occur in any order.  once a nerve block starts to wear off it is usually completely gone within 60 minutes. If needed, your surgeon will give you a prescription for pain medication.  It will take about 60 minutes for the oral pain medication to become fully effective.  So, it is recommended that you start taking this medication before the nerve block first begins to  wear off, or when you first begin to feel discomfort. Keep in mind that nerve blocks often wear off in the middle of the night.  If you are going to bed and the block has not started to wear off or you have not started to have any discomfort, consider setting an alarm for 2 to 3 hours, so you can assess your block.  If you notice the block is wearing off or you are starting to have discomfort, you can take your pain medication. Take your pain medication only as prescribed.  Pain medication can cause sedation and decrease your breathing if you take more than you need for the level of pain that you have. Nausea is a common side effect of many pain medications.  You may want to eat something before taking your pain medicine to prevent nausea. After a peripheral nerve block, you cannot feel pain, pressure or extremes in temperature in the effected arm.  Because your arm is numb it is at an increased risk for injury.  To decrease the possibility of injury, please practice the following:  While you are awake change the position of your arm frequently to prevent too much pressure on any one area for prolonged periods of time.  If you have a cast or tight dressing, check the color or your fingers every couple of hours.  Call your surgeon with the appearance of any discoloration (white or blue). If you are given a sling to   wear before you go home, please wear it  at all times until the block has completely worn off.  Do not get up at night without your sling. If you experience any problems or concerns, please contact your surgeon's office. If you experience severe or prolonged shortness of breath go to the nearest emergency department. 

## 2022-08-24 NOTE — Op Note (Signed)
08/24/2022  2:13 PM  PATIENT:  Dana Patel    PRE-OPERATIVE DIAGNOSIS:  Left distal radius fracture  POST-OPERATIVE DIAGNOSIS:  Same  PROCEDURE:  OPEN REDUCTION INTERNAL FIXATION (ORIF) LEFT DISTAL RADIUS FRACTURE  SURGEON:  Juanell Fairly, MD  ANESTHESIA:   General  EBL:  Minimal  TOURNIQUET TIME::  103 minutes  PREOPERATIVE INDICATIONS:  Dana Patel is a  51 y.o. female with a diagnosis of Left distal radius fracture who failed conservative measures and elected for surgical management.    The risks benefits and alternatives were discussed with the patient preoperatively including but not limited to the risks of infection, bleeding, nerve injury, malunion, nonunion, wrist stiffness, persistent wrist pain, osteoarthritis and the need for further surgery. Medical risks include but are not limited to DVT and pulmonary embolism, myocardial infarction, stroke, pneumonia, respiratory failure and death. Patient and her husband understood these risks and wished to proceed.   OPERATIVE IMPLANTS: [employer] standard volar 3 hole plate  OPERATIVE FINDINGS: Comminuted, intra-articular fracture of the left distal radius with dorsal displacement.  OPERATIVE PROCEDURE: Patient was seen in the preoperative area. I marked the left hand with the word yes and my initials according the hospital's correct site of surgery protocol. I answered all questions by the patient. Patient was then brought to the operating room where she was placed supine on the operative table.  The patient underwent general anesthesia with an LMA.   The left arm was prepped and draped in a sterile fashion.  A tourniquet was applied to the left upper arm.  A timeout performed to verify the patient's name, date of birth, medical record number, correct site of surgery correct procedure to be performed. The timeout was also used a timeout to verify patient received antibiotics and appropriate instruments, implants and  radiographs studies were available in the room. Once all in attendance were in agreement case began.   Patient then had the operative extremity exsanguinated with an Esmarch. The tourniquet was placed on the left upper extremity and inflated 250 mm.  A manual reduction of the fracture was performed.  A K wire was placed through the radial styloid across the fracture to hold reduction.  The fracture reduction was confirmed on FluoroScan imaging.    A linear incision was then made over the FCR tendon which was retracted radially to protect the radial artery. The subcutaneous tissue was carefully dissected using Metzenbaum scissor and Adson pickup.  A Weitlaner retaining retractor was used to protect the radial artery and median nerve. The pronator quadratus was identified and incised and elevated off the volar surface of the distal radius. A standard 3-hole [employer] volar plate was then positioned on the under surface of the distal radius.  The plate was held into position with 2 K wires. The position of the plate was confirmed on AP and lateral images. Once the plate was well-positioned,distal bicortical screws were placed in the distal radial fragments.  The depth of each screw was measured with a depth gauge and confirmed on FluoroScan imaging. Care was taken to avoid penetration into the articular surface of the distal radius.  Once all distal pegs were placed, the attention was turned to placement of the bicortical shaft screws.  3 total bicortical shaft screws were placed.  The length of each screw was measured with a depth gauge and confirmed on FluoroScan imaging.  Final FluoroScan imaging of the construct were taken. The fracture was in anatomic position and the  hardware was well-positioned.   The wound was copiously irrigated. The quadratus muscle was then carefully placed over the plate.  0 Vicryl was used to repair the quadratus muscle.  Wound was then again purposely irrigated.  3-0 Vicryl  was used to approximate the subcutaneous tissue.  The skin was closed with 4-0 nylon in a horizontal mattress pattern.  There is strips, Xeroform and a dry sterile dressing were applied along with an AP splint. I was scrubbed and present for the entire case and all sharp and instrument counts were correct at the conclusion the case. The patient tolerated this procedure well. I spoke with parents in the postop consultation room to let them know the case had been performed without complication and the patient was stable in recovery room.   Kathreen Devoid, MD

## 2022-08-24 NOTE — H&P (Signed)
PREOPERATIVE H&P  Chief Complaint: Left Wrist Fracture  HPI: Dana Patel is a 51 y.o. female who presents for preoperative history and physical with a diagnosis of Left Wrist Fracture after falling off a horse 08/21/2022.  Patient was seen in the [facility] regional emergency department where closed reduction was performed along with placement of a sugar-tong splint.  Given the patient's fracture displacement, high level of activity at baseline and the fact that this is her dominant hand, it is recommended that she undergo open reduction internal fixation of her fracture.  The patient was in agreement with the plan for surgical fixation.    Past Medical History:  Diagnosis Date   Anxiety    Family history of breast cancer    Head ache    Herpes genitalis    History of mammogram 04/2013; 05/18/15   neg; neg   History of Papanicolaou smear of cervix 12/14/10; 06/02/14   -/-; -/-   Past Surgical History:  Procedure Laterality Date   APPENDECTOMY  2000   KNEE ARTHROSCOPY Left 1995   NOSE SURGERY     was in high school   Social History   Socioeconomic History   Marital status: Single    Spouse name: Not on file   Number of children: 0   Years of education: 16   Highest education level: Not on file  Occupational History   Occupation: Set designer    Comment: [employer]  Tobacco Use   Smoking status: Never   Smokeless tobacco: Never  Vaping Use   Vaping Use: Never used  Substance and Sexual Activity   Alcohol use: Yes    Alcohol/week: 1.0 standard drink of alcohol    Types: 1 Glasses of wine per week    Comment: daily   Drug use: No   Sexual activity: Not Currently    Birth control/protection: Pill  Other Topics Concern   Not on file  Social History Narrative   Not on file   Social Determinants of Health   Financial Resource Strain: Not on file  Food Insecurity: Not on file  Transportation Needs: Not on file  Physical Activity: Not on file  Stress: Not on file   Social Connections: Not on file   Family History  Problem Relation Age of Onset   Breast cancer Paternal Grandmother 54   Cancer Maternal Grandfather 42       brain, lung   Thyroid disease Mother        hypothyroidism   Hyperlipidemia Mother    Cancer Maternal Grandmother 34       stomach   Atrial fibrillation Father    Basal cell carcinoma Father    Hypertension Father    Healthy Sister    Stroke Paternal Grandfather    Allergies  Allergen Reactions   Amoxicillin Hives   Prior to Admission medications   Medication Sig Start Date End Date Taking? Authorizing Provider  norethindrone-ethinyl estradiol-FE (MICROGESTIN FE 1/20) 1-20 MG-MCG tablet Take 1 tablet by mouth daily. CONTINUOUS DOSING 12/19/21  Yes Copland, Ilona Sorrel, PA-C  oxyCODONE-acetaminophen (PERCOCET/ROXICET) 5-325 MG tablet Take 1 tablet by mouth every 6 (six) hours as needed for up to 3 days. 08/21/22 08/24/22 Yes Pia Mau M, PA-C  ondansetron (ZOFRAN-ODT) 4 MG disintegrating tablet Take 1 tablet (4 mg total) by mouth every 8 (eight) hours as needed for up to 5 days. Patient not taking: Reported on 08/23/2022 08/21/22 08/26/22  Orvil Feil, PA-C  valACYclovir (VALTREX) 500 MG tablet TAKE  1 TABLET BY MOUTH TWICE DAILY X 3 DAYS AS NEEDED FOR SYMPTOMS OR 1 TABLET DAILY FOR PREVENTATIVE 06/05/22   Copland, Alicia B, PA-C     Positive ROS: All other systems have been reviewed and were otherwise negative with the exception of those mentioned in the HPI and as above.  Physical Exam: General: Alert, no acute distress Cardiovascular: Regular rate and rhythm, no murmurs rubs or gallops.  No pedal edema Respiratory: Clear to auscultation bilaterally, no wheezes rales or rhonchi. No cyanosis, no use of accessory musculature GI: No organomegaly, abdomen is soft and non-tender nondistended with positive bowel sounds. Skin: Skin intact, no lesions within the operative field. Neurologic: Sensation intact  distally Psychiatric: Patient is competent for consent with normal mood and affect Lymphatic: No cervical lymphadenopathy  MUSCULOSKELETAL: Left wrist: Patient is wearing a sugar-tong splint this morning.  I examined her in the office yesterday and her skin was intact.  She had mild swelling.  Compartments were soft and compressible.  She can flex and extend her fingers today although the range of motion was limited due to swelling.  Patient still has intact sensation light touch in fingers well-perfused.    Assessment: Left closed displaced distal radius fracture  Plan: Plan for Procedure(s): OPEN REDUCTION INTERNAL FIXATION (ORIF) LEFT DISTAL RADIUS FRACTURE  Reviewed the details of the operation as well as the postoperative course with the patient prior to surgery in my office.  I have discussed this case with Dr. Donnald Garre our hand specialist who is in agreement with the plan for surgery.    A preop history and physical was performed at the bedside this morning.  The left hand/wrist was marked according to the hospital's correct site of surgery protocol.  I discussed the risks and benefits of surgery. The risks include but are not limited to infection, bleeding , nerve or blood vessel injury, joint stiffness or loss of motion, persistent pain, weakness or instability, malunion, nonunion and hardware failure and the need for further surgery. Patient understood these risks and wished to proceed.     Juanell Fairly, MD   08/24/2022 11:49 AM

## 2022-08-24 NOTE — Anesthesia Preprocedure Evaluation (Addendum)
Anesthesia Evaluation  Patient identified by MRN, date of birth, ID band Patient awake    Reviewed: Allergy & Precautions, H&P , NPO status , Patient's Chart, lab work & pertinent test results, reviewed documented beta blocker date and time   History of Anesthesia Complications (+) PONV and history of anesthetic complications  Airway Mallampati: I  TM Distance: >3 FB Neck ROM: full    Dental  (+) Dental Advidsory Given, Teeth Intact   Pulmonary neg pulmonary ROS   Pulmonary exam normal breath sounds clear to auscultation       Cardiovascular Exercise Tolerance: Good negative cardio ROS Normal cardiovascular exam Rhythm:regular Rate:Normal     Neuro/Psych  PSYCHIATRIC DISORDERS Anxiety     negative neurological ROS     GI/Hepatic negative GI ROS, Neg liver ROS,,,  Endo/Other  negative endocrine ROS    Renal/GU negative Renal ROS  negative genitourinary   Musculoskeletal   Abdominal   Peds  Hematology negative hematology ROS (+)   Anesthesia Other Findings Past Medical History: No date: Anxiety No date: Family history of breast cancer No date: Head ache No date: Herpes genitalis 04/2013; 05/18/15: History of mammogram     Comment:  neg; neg 12/14/10; 06/02/14: History of Papanicolaou smear of cervix     Comment:  -/-; -/-   Reproductive/Obstetrics negative OB ROS                             Anesthesia Physical Anesthesia Plan  ASA: 1  Anesthesia Plan: General   Post-op Pain Management:    Induction: Intravenous  PONV Risk Score and Plan: 3 and Ondansetron, Dexamethasone, Midazolam and Treatment may vary due to age or medical condition  Airway Management Planned: LMA  Additional Equipment:   Intra-op Plan:   Post-operative Plan: Extubation in OR  Informed Consent: I have reviewed the patients History and Physical, chart, labs and discussed the procedure including the risks,  benefits and alternatives for the proposed anesthesia with the patient or authorized representative who has indicated his/her understanding and acceptance.     Dental Advisory Given  Plan Discussed with: Anesthesiologist, CRNA and Surgeon  Anesthesia Plan Comments:         Anesthesia Quick Evaluation

## 2022-08-24 NOTE — Anesthesia Procedure Notes (Signed)
Anesthesia Regional Block: Supraclavicular block   Pre-Anesthetic Checklist: , timeout performed,  Correct Patient, Correct Site, Correct Laterality,  Correct Procedure, Correct Position, site marked,  Risks and benefits discussed,  Surgical consent,  Pre-op evaluation,  At surgeon's request and post-op pain management  Laterality: Left and Upper  Prep: chloraprep       Needles:  Injection technique: Single-shot  Needle Type: Stimiplex     Needle Length: 5cm  Needle Gauge: 22     Additional Needles:   Procedures:,,,, ultrasound used (permanent image in chart),,    Narrative:  Start time: 08/24/2022 3:30 PM End time: 08/24/2022 3:34 PM Injection made incrementally with aspirations every 5 mL.  Performed by: Personally  Anesthesiologist: Lenard Simmer, MD  Additional Notes: Functioning IV was confirmed and monitors were applied.  A 11mm 22ga Stimuplex needle was used. Sterile prep and drape,hand hygiene and sterile gloves were used.  Negative aspiration and negative test dose prior to incremental administration of local anesthetic. The patient tolerated the procedure well.

## 2022-08-24 NOTE — Anesthesia Procedure Notes (Signed)
Procedure Name: LMA Insertion Date/Time: 08/24/2022 12:00 PM  Performed by: Stormy Fabian, CRNAPre-anesthesia Checklist: Patient identified, Patient being monitored, Timeout performed, Emergency Drugs available and Suction available Patient Re-evaluated:Patient Re-evaluated prior to induction Oxygen Delivery Method: Circle system utilized Preoxygenation: Pre-oxygenation with 100% oxygen Induction Type: IV induction Ventilation: Mask ventilation without difficulty LMA: LMA inserted LMA Size: 4.0 Tube type: Oral Number of attempts: 1 Placement Confirmation: positive ETCO2 and breath sounds checked- equal and bilateral Tube secured with: Tape Dental Injury: Teeth and Oropharynx as per pre-operative assessment

## 2022-08-25 ENCOUNTER — Encounter: Payer: Self-pay | Admitting: Orthopedic Surgery

## 2022-08-28 NOTE — Anesthesia Postprocedure Evaluation (Signed)
Anesthesia Post Note  Patient: KORRIE HOFBAUER  Procedure(s) Performed: OPEN REDUCTION INTERNAL FIXATION (ORIF) DISTAL RADIUS FRACTURE (Left: Wrist)  Patient location during evaluation: PACU Anesthesia Type: General Level of consciousness: awake and alert Pain management: pain level controlled Vital Signs Assessment: post-procedure vital signs reviewed and stable Respiratory status: spontaneous breathing, nonlabored ventilation, respiratory function stable and patient connected to nasal cannula oxygen Cardiovascular status: blood pressure returned to baseline and stable Postop Assessment: no apparent nausea or vomiting Anesthetic complications: no   No notable events documented.   Last Vitals:  Vitals:   08/24/22 1540 08/24/22 1557  BP: 136/87 (!) 143/90  Pulse: 100 (!) 106  Resp: (!) 21 20  Temp:  36.6 C  SpO2: 95% 94%    Last Pain:  Vitals:   08/24/22 1557  TempSrc:   PainSc: 0-No pain                 Lenard Simmer

## 2022-09-04 DIAGNOSIS — S52532A Colles' fracture of left radius, initial encounter for closed fracture: Secondary | ICD-10-CM | POA: Diagnosis not present

## 2022-09-13 DIAGNOSIS — S52532D Colles' fracture of left radius, subsequent encounter for closed fracture with routine healing: Secondary | ICD-10-CM | POA: Diagnosis not present

## 2022-09-18 DIAGNOSIS — S52532D Colles' fracture of left radius, subsequent encounter for closed fracture with routine healing: Secondary | ICD-10-CM | POA: Diagnosis not present

## 2022-09-22 DIAGNOSIS — S52532D Colles' fracture of left radius, subsequent encounter for closed fracture with routine healing: Secondary | ICD-10-CM | POA: Diagnosis not present

## 2022-09-26 DIAGNOSIS — S52532D Colles' fracture of left radius, subsequent encounter for closed fracture with routine healing: Secondary | ICD-10-CM | POA: Diagnosis not present

## 2022-09-29 DIAGNOSIS — S52532D Colles' fracture of left radius, subsequent encounter for closed fracture with routine healing: Secondary | ICD-10-CM | POA: Diagnosis not present

## 2022-09-30 ENCOUNTER — Other Ambulatory Visit: Payer: Self-pay | Admitting: Obstetrics and Gynecology

## 2022-09-30 DIAGNOSIS — Z3041 Encounter for surveillance of contraceptive pills: Secondary | ICD-10-CM

## 2022-10-04 DIAGNOSIS — S52532D Colles' fracture of left radius, subsequent encounter for closed fracture with routine healing: Secondary | ICD-10-CM | POA: Diagnosis not present

## 2022-11-17 DIAGNOSIS — S52532D Colles' fracture of left radius, subsequent encounter for closed fracture with routine healing: Secondary | ICD-10-CM | POA: Diagnosis not present

## 2023-01-29 ENCOUNTER — Encounter: Payer: Self-pay | Admitting: Obstetrics and Gynecology

## 2023-01-29 ENCOUNTER — Other Ambulatory Visit: Payer: Self-pay | Admitting: Obstetrics and Gynecology

## 2023-01-29 ENCOUNTER — Ambulatory Visit
Admission: RE | Admit: 2023-01-29 | Discharge: 2023-01-29 | Disposition: A | Discharge status: Home / Self Care | Payer: 59 | Source: Ambulatory Visit | Attending: Obstetrics and Gynecology | Admitting: Obstetrics and Gynecology

## 2023-01-29 DIAGNOSIS — Z1231 Encounter for screening mammogram for malignant neoplasm of breast: Secondary | ICD-10-CM | POA: Diagnosis not present

## 2023-02-05 ENCOUNTER — Encounter: Payer: Self-pay | Admitting: Obstetrics and Gynecology

## 2023-02-05 ENCOUNTER — Other Ambulatory Visit: Payer: Self-pay | Admitting: Obstetrics and Gynecology

## 2023-02-05 DIAGNOSIS — Z0000 Encounter for general adult medical examination without abnormal findings: Secondary | ICD-10-CM

## 2023-02-05 DIAGNOSIS — Z13220 Encounter for screening for lipoid disorders: Secondary | ICD-10-CM

## 2023-02-05 DIAGNOSIS — R7989 Other specified abnormal findings of blood chemistry: Secondary | ICD-10-CM

## 2023-02-05 DIAGNOSIS — Z113 Encounter for screening for infections with a predominantly sexual mode of transmission: Secondary | ICD-10-CM

## 2023-02-05 NOTE — Progress Notes (Signed)
Lab orders before annual.

## 2023-02-21 ENCOUNTER — Ambulatory Visit: Payer: 59

## 2023-02-21 DIAGNOSIS — Z13220 Encounter for screening for lipoid disorders: Secondary | ICD-10-CM | POA: Diagnosis not present

## 2023-02-21 DIAGNOSIS — Z0000 Encounter for general adult medical examination without abnormal findings: Secondary | ICD-10-CM

## 2023-02-22 LAB — COMPREHENSIVE METABOLIC PANEL
ALT: 51 IU/L — ABNORMAL HIGH (ref 0–32)
AST: 64 IU/L — ABNORMAL HIGH (ref 0–40)
Albumin/Globulin Ratio: 2.1 (ref 1.2–2.2)
Albumin: 3.9 g/dL (ref 3.8–4.9)
Alkaline Phosphatase: 52 IU/L (ref 44–121)
BUN/Creatinine Ratio: 10 (ref 9–23)
BUN: 8 mg/dL (ref 6–24)
Bilirubin Total: 0.5 mg/dL (ref 0.0–1.2)
CO2: 20 mmol/L (ref 20–29)
Calcium: 9 mg/dL (ref 8.7–10.2)
Chloride: 106 mmol/L (ref 96–106)
Creatinine, Ser: 0.84 mg/dL (ref 0.57–1.00)
Globulin, Total: 1.9 g/dL (ref 1.5–4.5)
Glucose: 78 mg/dL (ref 70–99)
Potassium: 4.2 mmol/L (ref 3.5–5.2)
Sodium: 139 mmol/L (ref 134–144)
Total Protein: 5.8 g/dL — ABNORMAL LOW (ref 6.0–8.5)
eGFR: 84 mL/min/{1.73_m2} (ref >59)

## 2023-02-22 LAB — LIPID PANEL
Chol/HDL Ratio: 3.4 ratio (ref 0.0–4.4)
Cholesterol, Total: 188 mg/dL (ref 100–199)
HDL: 56 mg/dL (ref >39)
LDL Chol Calc (NIH): 118 mg/dL — ABNORMAL HIGH (ref 0–99)
Triglycerides: 75 mg/dL (ref 0–149)
VLDL Cholesterol Cal: 14 mg/dL (ref 5–40)

## 2023-03-12 ENCOUNTER — Other Ambulatory Visit: Payer: 59

## 2023-03-12 DIAGNOSIS — Z113 Encounter for screening for infections with a predominantly sexual mode of transmission: Secondary | ICD-10-CM | POA: Diagnosis not present

## 2023-03-12 DIAGNOSIS — R7989 Other specified abnormal findings of blood chemistry: Secondary | ICD-10-CM | POA: Diagnosis not present

## 2023-03-13 ENCOUNTER — Other Ambulatory Visit: Payer: Self-pay | Admitting: Obstetrics and Gynecology

## 2023-03-13 DIAGNOSIS — Z3041 Encounter for surveillance of contraceptive pills: Secondary | ICD-10-CM

## 2023-03-13 LAB — COMPREHENSIVE METABOLIC PANEL
ALT: 41 IU/L — ABNORMAL HIGH (ref 0–32)
AST: 51 IU/L — ABNORMAL HIGH (ref 0–40)
Albumin/Globulin Ratio: 1.8 (ref 1.2–2.2)
Albumin: 4.1 g/dL (ref 3.8–4.9)
Alkaline Phosphatase: 64 IU/L (ref 44–121)
BUN/Creatinine Ratio: 14 (ref 9–23)
BUN: 12 mg/dL (ref 6–24)
Bilirubin Total: 0.4 mg/dL (ref 0.0–1.2)
CO2: 20 mmol/L (ref 20–29)
Calcium: 9.5 mg/dL (ref 8.7–10.2)
Chloride: 105 mmol/L (ref 96–106)
Creatinine, Ser: 0.84 mg/dL (ref 0.57–1.00)
Globulin, Total: 2.3 g/dL (ref 1.5–4.5)
Glucose: 85 mg/dL (ref 70–99)
Potassium: 4.4 mmol/L (ref 3.5–5.2)
Sodium: 139 mmol/L (ref 134–144)
Total Protein: 6.4 g/dL (ref 6.0–8.5)
eGFR: 84 mL/min/{1.73_m2} (ref >59)

## 2023-03-13 LAB — HEPATITIS C ANTIBODY: Hep C Virus Ab: NONREACTIVE

## 2023-03-18 NOTE — Progress Notes (Deleted)
No chief complaint on file.    HPI:      [name] is a 52 y.o. G0P0000 who LMP was Patient's last menstrual period was 12/06/2020 (approximate)., presents today for her annual examination. Her menses are infrequent with OCPs, lasting 1-2 days, light flow, no BTB, comes on placebo pills. Dysmenorrhea none. Having headaches, bloating, and night sweats on placebo pills now every cycle. Tried continuous dosing and felt better. Would like to continue as cont dosing. Went off pills in not too distant future and had heavy period.  Sex activity: off and on,  contraception - OCP (estrogen/progesterone).  Last Pap: 12/16/20 Results were: no abnormalities /neg HPV DNA  Hx of STDs: HSV, takes valtrex prn sx; sx triggered by stress.    Last mammogram: 01/29/23 Results were: normal--routine follow-up in 12 months.   There is a FH of breast cancer in her PGM, genetic testing not indicated. There is no FH of ovarian cancer. The patient does do self-breast exams.  Tobacco use: The patient denies current or previous tobacco use. Alcohol use: glass of wine with dinner  No drug use. Exercise: very active   Colonoscopy: never; had to cancel last yr and never rescheduled.   She does get adequate calcium and Vitamin D in her diet.   Elevated LFTs 5/24 with improvement (but still slightly abnormal) on repeat 6/24/neg hep C Ab. Pt taking valtrex daily which can increase LFTs.    Past Medical History:  Diagnosis Date   Anxiety    Family history of breast cancer    Head ache    Herpes genitalis    History of mammogram 04/2013; 05/18/15   neg; neg   History of Papanicolaou smear of cervix 12/14/10; 06/02/14   -/-; -/-    Past Surgical History:  Procedure Laterality Date   APPENDECTOMY  2000   KNEE ARTHROSCOPY Left 1995   NOSE SURGERY     was in high school   OPEN REDUCTION INTERNAL FIXATION (ORIF) DISTAL RADIAL FRACTURE Left 08/24/2022   Procedure: OPEN REDUCTION INTERNAL FIXATION (ORIF) DISTAL  RADIUS FRACTURE;  Surgeon: Juanell Fairly, MD;  Location: [facility] ORS;  Service: Orthopedics;  Laterality: Left;    Family History  Problem Relation Age of Onset   Breast cancer Paternal Grandmother 36   Cancer Maternal Grandfather 33       brain, lung   Thyroid disease Mother        hypothyroidism   Hyperlipidemia Mother    Cancer Maternal Grandmother 71       stomach   Atrial fibrillation Father    Basal cell carcinoma Father    Hypertension Father    Healthy Sister    Stroke Paternal Grandfather     Social History   Socioeconomic History   Marital status: Single    Spouse name: Not on file   Number of children: 0   Years of education: 16   Highest education level: Not on file  Occupational History   Occupation: Set designer    Comment: [employer]  Tobacco Use   Smoking status: Never   Smokeless tobacco: Never  Vaping Use   Vaping Use: Never used  Substance and Sexual Activity   Alcohol use: Yes    Alcohol/week: 1.0 standard drink of alcohol    Types: 1 Glasses of wine per week    Comment: daily   Drug use: No   Sexual activity: Not Currently    Birth control/protection: Pill  Other Topics Concern  Not on file  Social History Narrative   Not on file   Social Determinants of Health   Financial Resource Strain: Not on file  Food Insecurity: Not on file  Transportation Needs: Not on file  Physical Activity: Not on file  Stress: Not on file  Social Connections: Not on file  Intimate Partner Violence: Not on file     Current Outpatient Medications:    norethindrone-ethinyl estradiol-FE (JUNEL FE 1/20) 1-20 MG-MCG tablet, TAKE 1 TABLET BY MOUTH EVERY DAY , CONTNUOUS DOSING AS DIRECTED, Disp: 28 tablet, Rfl: 0   oxyCODONE (OXY IR/ROXICODONE) 5 MG immediate release tablet, Take 1 tablet (5 mg total) by mouth every 4 (four) hours as needed for severe pain., Disp: 30 tablet, Rfl: 0   valACYclovir (VALTREX) 500 MG tablet, TAKE 1 TABLET BY MOUTH TWICE  DAILY X 3 DAYS AS NEEDED FOR SYMPTOMS OR 1 TABLET DAILY FOR PREVENTATIVE, Disp: 90 tablet, Rfl: 2  ROS:  Review of Systems  Constitutional:  Negative for fatigue, fever and unexpected weight change.  Respiratory:  Negative for cough, shortness of breath and wheezing.   Cardiovascular:  Negative for chest pain, palpitations and leg swelling.  Gastrointestinal:  Negative for blood in stool, constipation, diarrhea, nausea and vomiting.  Endocrine: Negative for cold intolerance, heat intolerance and polyuria.  Genitourinary:  Negative for dyspareunia, dysuria, flank pain, frequency, genital sores, hematuria, menstrual problem, pelvic pain, urgency, vaginal bleeding, vaginal discharge and vaginal pain.  Musculoskeletal:  Negative for back pain, joint swelling and myalgias.  Skin:  Negative for rash.  Neurological:  Negative for dizziness, syncope, light-headedness, numbness and headaches.  Hematological:  Negative for adenopathy.  Psychiatric/Behavioral:  Negative for agitation, confusion, sleep disturbance and suicidal ideas. The patient is not nervous/anxious.      Objective: LMP 12/06/2020 (Approximate)    Physical Exam Constitutional:      Appearance: She is well-developed.  Genitourinary:     Vulva normal.     Right Labia: No rash, tenderness or lesions.    Left Labia: No tenderness, lesions or rash.    No vaginal discharge, erythema or tenderness.      Right Adnexa: not tender and no mass present.    Left Adnexa: not tender and no mass present.    No cervical motion tenderness, friability or polyp.     Uterus is not enlarged or tender.  Breasts:    Right: No mass, nipple discharge, skin change or tenderness.     Left: No mass, nipple discharge, skin change or tenderness.  Neck:     Thyroid: No thyromegaly.  Cardiovascular:     Rate and Rhythm: Normal rate and regular rhythm.     Heart sounds: Normal heart sounds. No murmur heard. Pulmonary:     Effort: Pulmonary effort  is normal.     Breath sounds: Normal breath sounds.  Abdominal:     Palpations: Abdomen is soft.     Tenderness: There is no abdominal tenderness. There is no guarding or rebound.  Musculoskeletal:        General: Normal range of motion.     Cervical back: Normal range of motion.  Lymphadenopathy:     Cervical: No cervical adenopathy.  Neurological:     General: No focal deficit present.     Mental Status: She is alert and oriented to person, place, and time.     Cranial Nerves: No cranial nerve deficit.  Skin:    General: Skin is warm and dry.  Psychiatric:  Mood and Affect: Mood normal.        Behavior: Behavior normal.        Thought Content: Thought content normal.        Judgment: Judgment normal.  Vitals reviewed.     Assessment/Plan: Encounter for annual routine gynecological examination  Encounter for surveillance of contraceptive pills - Plan: norethindrone-ethinyl estradiol-FE (MICROGESTIN FE 1/20) 1-20 MG-MCG tablet; OCP RF. Will do continuous dosing as long as possible before placebo pills. F/u prn.   Encounter for screening mammogram for malignant neoplasm of breast - Plan: MM 3D SCREEN BREAST BILATERAL; pt to schedule mammo  Screening for colon cancer - Plan: Ambulatory referral to Gastroenterology; refer to GI, pt ready to reschedule colonoscopy  Herpes simplex vulvovaginitis - Plan: valACYclovir (VALTREX) 500 MG tablet; Rx RF  Blood tests for routine general physical examination - Plan: Comprehensive metabolic panel, Lipid panel  Screening cholesterol level - Plan: Lipid panel              No orders of the defined types were placed in this encounter.   GYN counsel mammography screening, adequate intake of calcium and vitamin D, diet and exercise     F/U  No follow-ups on file.  Amine Adelson B. Dontrel Smethers, PA-C 03/18/2023 6:14 PM

## 2023-03-19 ENCOUNTER — Ambulatory Visit (INDEPENDENT_AMBULATORY_CARE_PROVIDER_SITE_OTHER): Payer: 59 | Admitting: Obstetrics and Gynecology

## 2023-03-19 ENCOUNTER — Encounter: Payer: Self-pay | Admitting: Obstetrics and Gynecology

## 2023-03-19 ENCOUNTER — Ambulatory Visit: Payer: 59 | Admitting: Obstetrics and Gynecology

## 2023-03-19 VITALS — BP 130/80 | HR 80 | Ht 71.0 in | Wt 174.6 lb

## 2023-03-19 DIAGNOSIS — Z1211 Encounter for screening for malignant neoplasm of colon: Secondary | ICD-10-CM

## 2023-03-19 DIAGNOSIS — Z01419 Encounter for gynecological examination (general) (routine) without abnormal findings: Secondary | ICD-10-CM | POA: Diagnosis not present

## 2023-03-19 DIAGNOSIS — Z3041 Encounter for surveillance of contraceptive pills: Secondary | ICD-10-CM

## 2023-03-19 DIAGNOSIS — R7989 Other specified abnormal findings of blood chemistry: Secondary | ICD-10-CM

## 2023-03-19 DIAGNOSIS — Z1231 Encounter for screening mammogram for malignant neoplasm of breast: Secondary | ICD-10-CM

## 2023-03-19 MED ORDER — NORETHIN ACE-ETH ESTRAD-FE 1-20 MG-MCG PO TABS: ORAL_TABLET | ORAL | 4 refills | Status: DC

## 2023-03-19 NOTE — Progress Notes (Signed)
Chief Complaint  Patient presents with   Gynecologic Exam     HPI:      [name] is a 52 y.o. G0P0000 who LMP was Patient's last menstrual period was 12/06/2020 (approximate)., presents today for her annual examination. Her menses are absent now with cont dosing OCPs, Dysmenorrhea none, no BTB. Had headaches, bloating, and night sweats on placebo pills every cycle; sx resolved with cont dosing. Headaches improved since 10/23.  Went off pills a couple yrs ago and had heavy period. No vasomotor sx.   Sex activity: off and on,  contraception - OCP (estrogen/progesterone). No pain/bleeding/dryness. Last Pap: 12/16/20 Results were: no abnormalities /neg HPV DNA  Hx of STDs: HSV, takes valtrex prn sx; sx triggered by stress.    Last mammogram: 01/29/23  Results were: normal--routine follow-up in 12 months.   There is a FH of breast cancer in her PGM, genetic testing not indicated. There is no FH of ovarian cancer. The patient does do self-breast exams.  Tobacco use: The patient denies current or previous tobacco use. Alcohol use: wknds No drug use. Exercise: very active   Colonoscopy: never; had neg FOBT through [facility]; interested in cologuard  She does get adequate calcium and Vitamin D in her diet.  Did fasting labs 5/24 with elevated LFTs. Pt taking tylenol and daily valtrex at the time. Repeat LFTs 03/12/23 improved but still slightly elevated. Neg Hep C. Stopped daily valtrex about 9 days before repeat labs. Now takes sporadically. (Literature states valtrex can increase LFTs). No prior labs to compare LFTs. Pt is not obese, alcohol on wknds only.    Past Medical History:  Diagnosis Date   Anxiety    Family history of breast cancer    Head ache    Herpes genitalis    History of mammogram 04/2013; 05/18/15   neg; neg   History of Papanicolaou smear of cervix 12/14/10; 06/02/14   -/-; -/-    Past Surgical History:  Procedure Laterality Date   APPENDECTOMY  2000   KNEE  ARTHROSCOPY Left 1995   NOSE SURGERY     was in high school   OPEN REDUCTION INTERNAL FIXATION (ORIF) DISTAL RADIAL FRACTURE Left 08/24/2022   Procedure: OPEN REDUCTION INTERNAL FIXATION (ORIF) DISTAL RADIUS FRACTURE;  Surgeon: Juanell Fairly, MD;  Location: [facility] OR[facility];  Service: Orthopedics;  Laterality: Left;    Family History  Problem Relation Age of Onset   Breast cancer Paternal Grandmother 22   Cancer Maternal Grandfather 43       brain, lung   Thyroid disease Mother        hypothyroidism   Hyperlipidemia Mother    Cancer Maternal Grandmother 41       stomach   Atrial fibrillation Father    Basal cell carcinoma Father    Hypertension Father    Healthy Sister    Stroke Paternal Grandfather     Social History   Socioeconomic History   Marital status: Single    Spouse name: Not on file   Number of children: 0   Years of education: 16   Highest education level: Not on file  Occupational History   Occupation: Set designer    Comment: [employer]  Tobacco Use   Smoking status: Never   Smokeless tobacco: Never  Vaping Use   Vaping Use: Never used  Substance and Sexual Activity   Alcohol use: Yes    Alcohol/week: 1.0 standard drink of alcohol    Types: 1 Glasses  of wine per week    Comment: daily   Drug use: No   Sexual activity: Yes    Birth control/protection: Pill  Other Topics Concern   Not on file  Social History Narrative   Not on file   Social Determinants of Health   Financial Resource Strain: Not on file  Food Insecurity: Not on file  Transportation Needs: Not on file  Physical Activity: Not on file  Stress: Not on file  Social Connections: Not on file  Intimate Partner Violence: Not on file     Current Outpatient Medications:    valACYclovir (VALTREX) 500 MG tablet, TAKE 1 TABLET BY MOUTH TWICE DAILY X 3 DAYS AS NEEDED FOR SYMPTOMS OR 1 TABLET DAILY FOR PREVENTATIVE, Disp: 90 tablet, Rfl: 2   norethindrone-ethinyl estradiol-FE (JUNEL FE  1/20) 1-20 MG-MCG tablet, TAKE 1 TABLET BY MOUTH EVERY DAY , CONTNUOUS DOSING AS DIRECTED, Disp: 84 tablet, Rfl: 4   oxyCODONE (OXY IR/ROXICODONE) 5 MG immediate release tablet, Take 1 tablet (5 mg total) by mouth every 4 (four) hours as needed for severe pain. (Patient not taking: Reported on 03/19/2023), Disp: 30 tablet, Rfl: 0  ROS:  Review of Systems  Constitutional:  Negative for fatigue, fever and unexpected weight change.  Respiratory:  Negative for cough, shortness of breath and wheezing.   Cardiovascular:  Negative for chest pain, palpitations and leg swelling.  Gastrointestinal:  Negative for blood in stool, constipation, diarrhea, nausea and vomiting.  Endocrine: Negative for cold intolerance, heat intolerance and polyuria.  Genitourinary:  Negative for dyspareunia, dysuria, flank pain, frequency, genital sores, hematuria, menstrual problem, pelvic pain, urgency, vaginal bleeding, vaginal discharge and vaginal pain.  Musculoskeletal:  Negative for back pain, joint swelling and myalgias.  Skin:  Negative for rash.  Neurological:  Negative for dizziness, syncope, light-headedness, numbness and headaches.  Hematological:  Negative for adenopathy.  Psychiatric/Behavioral:  Negative for agitation, confusion, sleep disturbance and suicidal ideas. The patient is not nervous/anxious.      Objective: BP 130/80   Pulse 80   Ht 5\' 11"  (1.803 m)   Wt 174 lb 9.6 oz (79.2 kg)   LMP 12/06/2020 (Approximate)   BMI 24.35 kg/m    Physical Exam Constitutional:      Appearance: She is well-developed.  Genitourinary:     Vulva normal.     Right Labia: No rash, tenderness or lesions.    Left Labia: No tenderness, lesions or rash.    No vaginal discharge, erythema or tenderness.      Right Adnexa: not tender and no mass present.    Left Adnexa: not tender and no mass present.    No cervical motion tenderness, friability or polyp.     Uterus is not enlarged or tender.  Breasts:     Right: No mass, nipple discharge, skin change or tenderness.     Left: No mass, nipple discharge, skin change or tenderness.  Neck:     Thyroid: No thyromegaly.  Cardiovascular:     Rate and Rhythm: Normal rate and regular rhythm.     Heart sounds: Normal heart sounds. No murmur heard. Pulmonary:     Effort: Pulmonary effort is normal.     Breath sounds: Normal breath sounds.  Abdominal:     Palpations: Abdomen is soft.     Tenderness: There is no abdominal tenderness. There is no guarding or rebound.  Musculoskeletal:        General: Normal range of motion.     Cervical  back: Normal range of motion.  Lymphadenopathy:     Cervical: No cervical adenopathy.  Neurological:     General: No focal deficit present.     Mental Status: She is alert and oriented to person, place, and time.     Cranial Nerves: No cranial nerve deficit.  Skin:    General: Skin is warm and dry.  Psychiatric:        Mood and Affect: Mood normal.        Behavior: Behavior normal.        Thought Content: Thought content normal.        Judgment: Judgment normal.  Vitals reviewed.     Assessment/Plan: Encounter for annual routine gynecological examination  Encounter for surveillance of contraceptive pills - Plan: norethindrone-ethinyl estradiol-FE (JUNEL FE 1/20) 1-20 MG-MCG tablet; Rx RF eRxd. Doing well with cont dosing.  Encounter for screening mammogram for malignant neoplasm of breast; pt current on mammo  Screening for colon cancer - Plan: Cologuard; colonoscopy/cologuard discussed. Pt elects cologuard. Ref sent. Will f/u with results.  Elevated LFTs - Plan: Comprehensive metabolic panel, Hepatitis B surface antigen, Hepatitis B Surface AntiBODY; repeat labs in 3 months, rule out Hep B.              Meds ordered this encounter  Medications   norethindrone-ethinyl estradiol-FE (JUNEL FE 1/20) 1-20 MG-MCG tablet    Sig: TAKE 1 TABLET BY MOUTH EVERY DAY , CONTNUOUS DOSING AS DIRECTED    Dispense:   84 tablet    Refill:  4    Order Specific Question:   Supervising Provider    Answer:   Waymon Budge    GYN counsel mammography screening, adequate intake of calcium and vitamin D, diet and exercise     F/U  Return in about 1 year (around 03/18/2024).  Dierks Wach B. Eryn Marandola, PA-C 03/19/2023 1:10 PM

## 2023-03-19 NOTE — Patient Instructions (Signed)
I value your feedback and you entrusting us with your care. If you get a [facility] patient survey, I would appreciate you taking the time to let us know about your experience today. Thank you! ? ? ?

## 2023-04-02 ENCOUNTER — Encounter: Payer: Self-pay | Admitting: Obstetrics and Gynecology

## 2023-04-05 ENCOUNTER — Other Ambulatory Visit: Payer: Self-pay | Admitting: Obstetrics and Gynecology

## 2023-04-05 DIAGNOSIS — Z3041 Encounter for surveillance of contraceptive pills: Secondary | ICD-10-CM

## 2023-04-30 ENCOUNTER — Other Ambulatory Visit: Payer: Self-pay | Admitting: Obstetrics and Gynecology

## 2023-04-30 DIAGNOSIS — A6004 Herpesviral vulvovaginitis: Secondary | ICD-10-CM

## 2023-04-30 MED ORDER — VALACYCLOVIR HCL 500 MG PO TABS: ORAL_TABLET | ORAL | 0 refills | Status: DC

## 2023-04-30 NOTE — Progress Notes (Signed)
Rx RF valtrex.  

## 2023-04-30 NOTE — Telephone Encounter (Signed)
Pls f/u with [facility] re: her test kit. Thx.

## 2023-05-01 NOTE — Addendum Note (Signed)
Addended by: Althea Grimmer B on: 05/01/2023 09:13 AM   Modules accepted: Orders

## 2023-05-10 DIAGNOSIS — D225 Melanocytic nevi of trunk: Secondary | ICD-10-CM | POA: Diagnosis not present

## 2023-05-10 DIAGNOSIS — I781 Nevus, non-neoplastic: Secondary | ICD-10-CM | POA: Diagnosis not present

## 2023-05-10 DIAGNOSIS — Z85828 Personal history of other malignant neoplasm of skin: Secondary | ICD-10-CM | POA: Diagnosis not present

## 2023-05-10 DIAGNOSIS — L821 Other seborrheic keratosis: Secondary | ICD-10-CM | POA: Diagnosis not present

## 2023-05-10 DIAGNOSIS — Z08 Encounter for follow-up examination after completed treatment for malignant neoplasm: Secondary | ICD-10-CM | POA: Diagnosis not present

## 2023-05-10 DIAGNOSIS — L578 Other skin changes due to chronic exposure to nonionizing radiation: Secondary | ICD-10-CM | POA: Diagnosis not present

## 2023-05-21 ENCOUNTER — Telehealth: Payer: Self-pay | Admitting: Obstetrics and Gynecology

## 2023-05-21 ENCOUNTER — Ambulatory Visit (INDEPENDENT_AMBULATORY_CARE_PROVIDER_SITE_OTHER): Payer: 59

## 2023-05-21 VITALS — BP 108/69 | HR 70 | Ht 71.0 in | Wt 181.0 lb

## 2023-05-21 DIAGNOSIS — R399 Unspecified symptoms and signs involving the genitourinary system: Secondary | ICD-10-CM | POA: Diagnosis not present

## 2023-05-21 DIAGNOSIS — Z3041 Encounter for surveillance of contraceptive pills: Secondary | ICD-10-CM

## 2023-05-21 DIAGNOSIS — A6004 Herpesviral vulvovaginitis: Secondary | ICD-10-CM

## 2023-05-21 LAB — POCT URINALYSIS DIPSTICK OB
Bilirubin, UA: NEGATIVE
Glucose, UA: NEGATIVE
Ketones, UA: NEGATIVE
Nitrite, UA: NEGATIVE
POC,PROTEIN,UA: NEGATIVE
Spec Grav, UA: <=1.005 — AB (ref 1.010–1.025)
Urobilinogen, UA: 0.2 E.U./dL
pH, UA: 5 (ref 5.0–8.0)

## 2023-05-21 MED ORDER — NITROFURANTOIN MONOHYD MACRO 100 MG PO CAPS: 100.0000 mg | ORAL_CAPSULE | Freq: Two times a day (BID) | ORAL | 0 refills | Status: AC

## 2023-05-21 MED ORDER — VALACYCLOVIR HCL 500 MG PO TABS: ORAL_TABLET | ORAL | 2 refills | Status: DC

## 2023-05-21 MED ORDER — NORETHIN ACE-ETH ESTRAD-FE 1-20 MG-MCG PO TABS: ORAL_TABLET | ORAL | 3 refills | Status: DC

## 2023-05-21 NOTE — Addendum Note (Signed)
Addended by: Donnetta Hail on: 05/21/2023 01:30 PM   Modules accepted: Orders

## 2023-05-21 NOTE — Telephone Encounter (Signed)
Rx RF valtrex; Rx change OCPs for cont dosing per insurance/pharmacist

## 2023-05-21 NOTE — Telephone Encounter (Signed)
Pls let FD know to put on your schedule. Thx.

## 2023-05-21 NOTE — Patient Instructions (Signed)
Urinary Tract Infection, Adult A urinary tract infection (UTI) is an infection of any part of the urinary tract. The urinary tract includes: The kidneys. The ureters. The bladder. The urethra. These organs make, store, and get rid of pee (urine) in the body. What are the causes? This infection is caused by germs (bacteria) in your genital area. These germs grow and cause swelling (inflammation) of your urinary tract. What increases the risk? The following factors may make you more likely to develop this condition: Using a small, thin tube (catheter) to drain pee. Not being able to control when you pee or poop (incontinence). Being female. If you are female, these things can increase the risk: Using these methods to prevent pregnancy: A medicine that kills sperm (spermicide). A device that blocks sperm (diaphragm). Having low levels of a female hormone (estrogen). Being pregnant. You are more likely to develop this condition if: You have genes that add to your risk. You are sexually active. You take antibiotic medicines. You have trouble peeing because of: A prostate that is bigger than normal, if you are female. A blockage in the part of your body that drains pee from the bladder. A kidney stone. A nerve condition that affects your bladder. Not getting enough to drink. Not peeing often enough. You have other conditions, such as: Diabetes. A weak disease-fighting system (immune system). Sickle cell disease. Gout. Injury of the spine. What are the signs or symptoms? Symptoms of this condition include: Needing to pee right away. Peeing small amounts often. Pain or burning when peeing. Blood in the pee. Pee that smells bad or not like normal. Trouble peeing. Pee that is cloudy. Fluid coming from the vagina, if you are female. Pain in the belly or lower back. Other symptoms include: Vomiting. Not feeling hungry. Feeling mixed up (confused). This may be the first symptom in  older adults. Being tired and grouchy (irritable). A fever. Watery poop (diarrhea). How is this treated? Taking antibiotic medicine. Taking other medicines. Drinking enough water. In some cases, you may need to see a specialist. Follow these instructions at home:  Medicines Take over-the-counter and prescription medicines only as told by your doctor. If you were prescribed an antibiotic medicine, take it as told by your doctor. Do not stop taking it even if you start to feel better. General instructions Make sure you: Pee until your bladder is empty. Do not hold pee for a long time. Empty your bladder after sex. Wipe from front to back after peeing or pooping if you are a female. Use each tissue one time when you wipe. Drink enough fluid to keep your pee pale yellow. Keep all follow-up visits. Contact a doctor if: You do not get better after 1-2 days. Your symptoms go away and then come back. Get help right away if: You have very bad back pain. You have very bad pain in your lower belly. You have a fever. You have chills. You feeling like you will vomit or you vomit. Summary A urinary tract infection (UTI) is an infection of any part of the urinary tract. This condition is caused by germs in your genital area. There are many risk factors for a UTI. Treatment includes antibiotic medicines. Drink enough fluid to keep your pee pale yellow. This information is not intended to replace advice given to you by your health care provider. Make sure you discuss any questions you have with your health care provider. Document Revised: 05/02/2020 Document Reviewed: 05/07/2020 Elsevier Patient Education    2024 Elsevier Inc.  

## 2023-05-21 NOTE — Progress Notes (Signed)
    NURSE VISIT NOTE  Subjective:    Patient ID: JANUITA NISH, female    DOB: 06-30-1971, 52 y.o.   MRN: [id number]       HPI  Patient is a 52 y.o. G0P0000 female who presents for dysuria, hematuria, and urinary frequency. Symptoms developed overnight.  Patient does not have a history of recurrent UTI.  Patient does not have a history of pyelonephritis.    Objective:    BP 108/69   Pulse 70   Ht 5\' 11"  (1.803 m)   Wt 181 lb (82.1 kg)   BMI 25.24 kg/m    Lab Review  Results for orders placed or performed in visit on 05/21/23  POC Urinalysis Dipstick OB  Result Value Ref Range   Color, UA     Clarity, UA     Glucose, UA Negative Negative   Bilirubin, UA neg    Ketones, UA neg    Spec Grav, UA <=1.005 (A) 1.010 - 1.025   Blood, UA 3+    pH, UA 5.0 5.0 - 8.0   POC,PROTEIN,UA Negative Negative, Trace, Small (1+), Moderate (2+), Large (3+), 4+   Urobilinogen, UA 0.2 0.2 or 1.0 E.U./dL   Nitrite, UA neg    Leukocytes, UA Small (1+) (A) Negative   Appearance     Odor      Assessment:   1. UTI symptoms      Plan:   Urine Culture sent. Macrobid prescription sent.   Donnetta Hail, CMA

## 2023-05-21 NOTE — Telephone Encounter (Signed)
FYI

## 2023-05-31 ENCOUNTER — Other Ambulatory Visit: Payer: Self-pay | Admitting: Medical Genetics

## 2023-05-31 DIAGNOSIS — Z006 Encounter for examination for normal comparison and control in clinical research program: Secondary | ICD-10-CM

## 2023-06-20 ENCOUNTER — Other Ambulatory Visit: Payer: Self-pay | Admitting: Obstetrics and Gynecology

## 2023-06-20 DIAGNOSIS — E669 Obesity, unspecified: Secondary | ICD-10-CM | POA: Diagnosis not present

## 2023-06-20 DIAGNOSIS — R030 Elevated blood-pressure reading, without diagnosis of hypertension: Secondary | ICD-10-CM | POA: Diagnosis not present

## 2023-06-20 DIAGNOSIS — R300 Dysuria: Secondary | ICD-10-CM | POA: Diagnosis not present

## 2023-06-20 DIAGNOSIS — Z6824 Body mass index (BMI) 24.0-24.9, adult: Secondary | ICD-10-CM | POA: Diagnosis not present

## 2023-06-20 DIAGNOSIS — N390 Urinary tract infection, site not specified: Secondary | ICD-10-CM | POA: Diagnosis not present

## 2023-06-20 DIAGNOSIS — A6004 Herpesviral vulvovaginitis: Secondary | ICD-10-CM

## 2023-11-12 ENCOUNTER — Encounter: Payer: Self-pay | Admitting: Obstetrics and Gynecology

## 2023-12-13 ENCOUNTER — Encounter: Payer: Self-pay | Admitting: Obstetrics and Gynecology

## 2023-12-13 ENCOUNTER — Other Ambulatory Visit: Payer: Self-pay | Admitting: Obstetrics and Gynecology

## 2023-12-13 DIAGNOSIS — Z3041 Encounter for surveillance of contraceptive pills: Secondary | ICD-10-CM

## 2024-01-04 ENCOUNTER — Other Ambulatory Visit: Payer: Self-pay | Admitting: Obstetrics and Gynecology

## 2024-01-04 DIAGNOSIS — Z1231 Encounter for screening mammogram for malignant neoplasm of breast: Secondary | ICD-10-CM

## 2024-01-22 ENCOUNTER — Encounter: Payer: Self-pay | Admitting: Family Medicine

## 2024-01-22 ENCOUNTER — Ambulatory Visit (INDEPENDENT_AMBULATORY_CARE_PROVIDER_SITE_OTHER): Admitting: Family Medicine

## 2024-01-22 VITALS — BP 133/80 | HR 87 | Resp 16 | Ht 71.0 in | Wt 180.9 lb

## 2024-01-22 DIAGNOSIS — F411 Generalized anxiety disorder: Secondary | ICD-10-CM

## 2024-01-22 DIAGNOSIS — F410 Panic disorder [episodic paroxysmal anxiety] without agoraphobia: Secondary | ICD-10-CM | POA: Diagnosis not present

## 2024-01-22 DIAGNOSIS — Z7689 Persons encountering health services in other specified circumstances: Secondary | ICD-10-CM | POA: Insufficient documentation

## 2024-01-22 DIAGNOSIS — G44229 Chronic tension-type headache, not intractable: Secondary | ICD-10-CM

## 2024-01-22 DIAGNOSIS — G43809 Other migraine, not intractable, without status migrainosus: Secondary | ICD-10-CM | POA: Diagnosis not present

## 2024-01-22 MED ORDER — ESCITALOPRAM OXALATE 10 MG PO TABS: 10.0000 mg | ORAL_TABLET | Freq: Every day | ORAL | 1 refills | Status: DC

## 2024-01-22 NOTE — Progress Notes (Signed)
 New patient visit   Patient: Dana Patel   DOB: [birth year]   53 y.o. Female  MRN: [id number] Visit Date: 01/22/2024  Today's healthcare provider: Carlean Charter, DO   Chief Complaint  Patient presents with   New Patient (Initial Visit)    NP and Anxiety.. Tension headaches from Anxiety  wants Xana x from past experience. Had a terrible episode last.    Subjective    Dana Patel is a 53 y.o. female who presents today as a new patient to establish care.  HPI HPI     New Patient (Initial Visit)    Additional comments: NP and Anxiety.. Tension headaches from Anxiety  wants Xana x from past experience. Had a terrible episode last.       Last edited by Estill Hemming, CMA on 01/22/2024  1:58 PM.       Dana Patel is a 53 year old female with anxiety and panic disorder who presents to establish care and address her anxiety and headache concerns.  She has a long-standing history of anxiety and panic disorder, initially diagnosed in the late 1990s while working in a high-stress environment. Her symptoms had been dormant for many years after leaving a stressful job but have resurfaced recently due to a new, stressful job and changes in her work environment. She currently takes 0.25 mg of Xanax approximately twice a week for panic attacks.  She experiences tension headaches that start in the late afternoon or evening, beginning at the back of her head and moving to her forehead. These headaches are sometimes accompanied by light sensitivity, which can trigger migraines. She has tried various non-pharmacological interventions such as breathing techniques, therapeutic massage, and environmental modifications to manage her symptoms. She has also used Botox and therapeutic massage to alleviate tension headaches, with mixed results. She avoids frequent use of NSAIDs and Tylenol  due to concerns about rebound headaches. She has been using compounded semaglutide since February, which she  suspects may contribute to her headaches, particularly at the start of the week.  Her family history is significant for her mother having anxiety, panic disorder, and migraines, which she believes may be related to her own symptoms.  Socially, she is a social drinker but has reduced alcohol consumption due to concerns about it exacerbating her headaches. She exercises regularly, hiking three miles four times a week, and notes that she does not experience headaches during these activities.  No waking up with headaches. Headaches do not typically cause nausea, although she has experienced nausea with migraines in the past. She has not had a recent tetanus vaccine and has not completed the shingles vaccine series.    Past Medical History:  Diagnosis Date   Anxiety    Family history of breast cancer    Head ache    Herpes genitalis    History of mammogram 04/2013; 05/18/15   neg; neg   History of Papanicolaou smear of cervix 12/14/10; 06/02/14   -/-; -/-   Past Surgical History:  Procedure Laterality Date   APPENDECTOMY  2000   KNEE ARTHROSCOPY Left 1995   NOSE SURGERY     was in high school   OPEN REDUCTION INTERNAL FIXATION (ORIF) DISTAL RADIAL FRACTURE Left 08/24/2022   Procedure: OPEN REDUCTION INTERNAL FIXATION (ORIF) DISTAL RADIUS FRACTURE;  Surgeon: Rande Bushy, MD;  Location: [facility] ORS;  Service: Orthopedics;  Laterality: Left;   Family Status  Relation Name Status   PGM  Deceased  MGF  Deceased   Mother  Alive   MGM  Deceased   Father  Alive   Sister  Alive   PGF  Deceased  No partnership data on file   Family History  Problem Relation Age of Onset   Breast cancer Paternal Grandmother 101   Cancer Maternal Grandfather 65       brain, lung   Thyroid disease Mother        hypothyroidism   Hyperlipidemia Mother    Cancer Maternal Grandmother 32       stomach   Atrial fibrillation Father    Basal cell carcinoma Father    Hypertension Father    Healthy Sister     Stroke Paternal Grandfather    Social History   Socioeconomic History   Marital status: Single    Spouse name: Not on file   Number of children: 0   Years of education: 16   Highest education level: Bachelor's degree (e.g., BA, AB, BS)  Occupational History   Occupation: Set designer    Comment: [employer]  Tobacco Use   Smoking status: Never   Smokeless tobacco: Never  Vaping Use   Vaping status: Never Used  Substance and Sexual Activity   Alcohol use: Yes    Alcohol/week: 1.0 standard drink of alcohol    Types: 1 Glasses of wine per week    Comment: daily   Drug use: No   Sexual activity: Yes    Birth control/protection: Pill  Other Topics Concern   Not on file  Social History Narrative   Not on file   Social Drivers of Health   Financial Resource Strain: Low Risk  (01/18/2024)   Overall Financial Resource Strain (CARDIA)    Difficulty of Paying Living Expenses: Not hard at all  Food Insecurity: No Food Insecurity (01/18/2024)   Hunger Vital Sign    Worried About Running Out of Food in the Last Year: Never true    Ran Out of Food in the Last Year: Never true  Transportation Needs: No Transportation Needs (01/18/2024)   PRAPARE - Administrator, Civil Service (Medical): No    Lack of Transportation (Non-Medical): No  Physical Activity: Sufficiently Active (01/18/2024)   Exercise Vital Sign    Days of Exercise per Week: 5 days    Minutes of Exercise per Session: 50 min  Stress: Stress Concern Present (01/18/2024)   [employer] of Occupational Health - Occupational Stress Questionnaire    Feeling of Stress : Very much  Social Connections: Socially Isolated (01/18/2024)   Social Connection and Isolation Panel [NHANES]    Frequency of Communication with Friends and Family: Once a week    Frequency of Social Gatherings with Friends and Family: Three times a week    Attends Religious Services: Never    Active Member of Clubs or Organizations: No     Attends Engineer, structural: Not on file    Marital Status: Never married   Outpatient Medications Prior to Visit  Medication Sig   norethindrone-ethinyl estradiol-FE (JUNEL  FE 1/20) 1-20 MG-MCG tablet TAKE 1 TABLET BY MOUTH EVERY DAY , CONTNUOUS DOSING AS DIRECTED   valACYclovir  (VALTREX ) 500 MG tablet TAKE 1 TABLET BY MOUTH TWICE A DAY FOR 3 DAYS AS NEEDED FOR SYMPTOMS, OR TAKE 1 DAILY FOR PREVENTION   No facility-administered medications prior to visit.   Allergies  Allergen Reactions   Amoxicillin Hives    Immunization History  Administered Date(s) Administered   Influenza  Inj Mdck Quad Pf 07/29/2021   Influenza,inj,Quad PF,6+ Mos 08/01/2018, 07/31/2019   Influenza-Unspecified 08/02/2017   PFIZER(Purple Top)SARS-COV-2 Vaccination 12/16/2019, 01/06/2020, 09/16/2020   Td 05/03/1989    Health Maintenance  Topic Date Due   DTaP/Tdap/Td (2 - Tdap) 02/07/2024 (Originally 05/04/1999)   HIV Screening  02/07/2024 (Originally 04/19/1986)   Zoster Vaccines- Shingrix (1 of 2) 02/07/2024 (Originally 04/19/2021)   Fecal DNA (Cologuard)  04/08/2024 (Originally 04/19/2016)   COVID-19 Vaccine (4 - 2024-25 season) 07/09/2024 (Originally 06/10/2023)   INFLUENZA VACCINE  05/09/2024   MAMMOGRAM  01/28/2025   Cervical Cancer Screening (HPV/Pap Cotest)  12/16/2025   Hepatitis C Screening  Completed   HPV VACCINES  Aged Out   Meningococcal B Vaccine  Aged Out    Patient Care Team: Letrice Pollok N, DO as PCP - General (Family Medicine)       Objective    BP 133/80 (BP Location: Left Arm, Patient Position: Sitting, Cuff Size: Normal)   Pulse 87   Resp 16   Ht 5\' 11"  (1.803 m)   Wt 180 lb 14.4 oz (82.1 kg)   SpO2 100%   BMI 25.23 kg/m    The 10-year ASCVD risk score (Arnett DK, et al., 2019) is: 1.5%   Values used to calculate the score:     Age: 84 years     Sex: Female     Is Non-Hispanic African American: No     Diabetic: No     Tobacco smoker: No     Systolic Blood  Pressure: 133 mmHg     Is BP treated: No     HDL Cholesterol: 56 mg/dL     Total Cholesterol: 188 mg/dL   Physical Exam Vitals and nursing note reviewed.  Constitutional:      General: She is not in acute distress.    Appearance: Normal appearance.  HENT:     Head: Normocephalic and atraumatic.  Eyes:     General: No scleral icterus.    Conjunctiva/sclera: Conjunctivae normal.  Cardiovascular:     Rate and Rhythm: Normal rate.  Pulmonary:     Effort: Pulmonary effort is normal.  Neurological:     Mental Status: She is alert and oriented to person, place, and time. Mental status is at baseline.  Psychiatric:        Mood and Affect: Mood normal.        Behavior: Behavior normal.      Depression Screen    01/22/2024    2:36 PM 07/02/2017    1:22 PM  PHQ 2/9 Scores  PHQ - 2 Score 2 0  PHQ- 9 Score 8 0   No results found for any visits on 01/22/24.  Assessment & Plan     Establishing care with new doctor, encounter for  Generalized anxiety disorder with panic attacks -     Escitalopram  Oxalate; Take 1 tablet (10 mg total) by mouth daily.  Dispense: 30 tablet; Refill: 1  Chronic tension-type headache, not intractable  Other migraine without status migrainosus, not intractable     Establishing care with new doctor, encounter for Due for shingles vaccine and tetanus booster. Colon cancer screening planned. - Recommend shingles vaccine series (Shingrix). - Administer tetanus booster if not received in the last 10 years. - Complete Cologuard test for colon cancer screening.  Generalized anxiety disorder with panic attacks exacerbation likely due to job stress and lifestyle changes. Alprazolam used sparingly. Open to daily medication. Lexapro  discussed for anxiety management and panic prevention. -  Prescribe Lexapro  (escitalopram ) 10 mg daily. - Follow up in 4 weeks via MyChart message to assess effectiveness and adjust dosage if necessary. - Schedule an in-person  follow-up in 8 weeks.  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 Light sensitivity and occasional migraines with aura. Non-pharmacological interventions ongoing. - Continue therapeutic massage, Botox, and lifestyle modifications. - Consider Excedrin Migraine for acute headache relief as needed.  Migraines Occasional migraines with aura, triggered by bright light or stress. Uses anti-nausea medication for severe episodes. No daily preventive medication due to infrequency. - Use Excedrin Migraine or ibuprofen for acute relief as needed. - Monitor frequency and severity; consider further intervention if frequency increases.   Return in about 2 months (around 03/23/2024) for Anx/Dep.     I discussed the assessment and treatment plan with the patient  The patient was provided an opportunity to ask questions and all were answered. The patient agreed with the plan and demonstrated an understanding of the instructions.   The patient was advised to call back or seek an in-person evaluation if the symptoms worsen or if the condition fails to improve as anticipated.    Carlean Charter, DO  [facility] [phone] (phone) [phone] (fax)  [facility]

## 2024-01-24 ENCOUNTER — Encounter: Payer: Self-pay | Admitting: Family Medicine

## 2024-01-24 DIAGNOSIS — F410 Panic disorder [episodic paroxysmal anxiety] without agoraphobia: Secondary | ICD-10-CM

## 2024-01-29 DIAGNOSIS — G43909 Migraine, unspecified, not intractable, without status migrainosus: Secondary | ICD-10-CM | POA: Insufficient documentation

## 2024-01-31 ENCOUNTER — Ambulatory Visit
Admission: RE | Admit: 2024-01-31 | Discharge: 2024-01-31 | Disposition: A | Discharge status: Home / Self Care | Source: Ambulatory Visit | Attending: Obstetrics and Gynecology | Admitting: Obstetrics and Gynecology

## 2024-01-31 DIAGNOSIS — Z1231 Encounter for screening mammogram for malignant neoplasm of breast: Secondary | ICD-10-CM | POA: Diagnosis not present

## 2024-02-02 MED ORDER — ALPRAZOLAM 0.25 MG PO TABS: 0.2500 mg | ORAL_TABLET | Freq: Every day | ORAL | 0 refills | Status: DC | PRN

## 2024-02-02 MED ORDER — FLUOXETINE HCL 20 MG PO CAPS: 20.0000 mg | ORAL_CAPSULE | Freq: Every day | ORAL | 1 refills | Status: DC

## 2024-02-04 ENCOUNTER — Encounter: Payer: Self-pay | Admitting: Obstetrics and Gynecology

## 2024-02-05 ENCOUNTER — Ambulatory Visit: Admitting: Physician Assistant

## 2024-02-07 DIAGNOSIS — G44221 Chronic tension-type headache, intractable: Secondary | ICD-10-CM | POA: Diagnosis not present

## 2024-02-07 DIAGNOSIS — E538 Deficiency of other specified B group vitamins: Secondary | ICD-10-CM | POA: Diagnosis not present

## 2024-02-07 DIAGNOSIS — R519 Headache, unspecified: Secondary | ICD-10-CM | POA: Diagnosis not present

## 2024-02-07 DIAGNOSIS — F410 Panic disorder [episodic paroxysmal anxiety] without agoraphobia: Secondary | ICD-10-CM | POA: Diagnosis not present

## 2024-02-07 DIAGNOSIS — M7918 Myalgia, other site: Secondary | ICD-10-CM | POA: Diagnosis not present

## 2024-02-07 DIAGNOSIS — M5481 Occipital neuralgia: Secondary | ICD-10-CM | POA: Diagnosis not present

## 2024-02-11 ENCOUNTER — Other Ambulatory Visit: Payer: Self-pay | Admitting: Neurology

## 2024-02-11 DIAGNOSIS — R519 Headache, unspecified: Secondary | ICD-10-CM

## 2024-02-20 DIAGNOSIS — E538 Deficiency of other specified B group vitamins: Secondary | ICD-10-CM | POA: Diagnosis not present

## 2024-03-05 DIAGNOSIS — E538 Deficiency of other specified B group vitamins: Secondary | ICD-10-CM | POA: Diagnosis not present

## 2024-03-12 ENCOUNTER — Ambulatory Visit
Admission: RE | Admit: 2024-03-12 | Discharge: 2024-03-12 | Disposition: A | Discharge status: Home / Self Care | Source: Ambulatory Visit | Attending: Emergency Medicine | Admitting: Emergency Medicine

## 2024-03-12 VITALS — BP 126/83 | HR 92 | Temp 98.7°F | Resp 18

## 2024-03-12 DIAGNOSIS — B370 Candidal stomatitis: Secondary | ICD-10-CM | POA: Diagnosis not present

## 2024-03-12 LAB — POCT RAPID STREP A (OFFICE): Rapid Strep A Screen: NEGATIVE

## 2024-03-12 MED ORDER — NYSTATIN 100000 UNIT/ML MT SUSP: 500000.0000 [IU] | Freq: Four times a day (QID) | OROMUCOSAL | 0 refills | Status: DC

## 2024-03-12 NOTE — Discharge Instructions (Addendum)
 Use the nystatin oral suspension as directed (swish, gargle, swallow).    Follow-up with your primary care provider if your symptoms are not improving.

## 2024-03-12 NOTE — ED Triage Notes (Signed)
 Sore throat that started Monday and Tuesday night notice white spots on  tongue, tonsil and roof of mouth. Pain 4/10. Has not taken anything for symptoms.

## 2024-03-12 NOTE — ED Provider Notes (Signed)
 [facility]    CSN: [id number] Arrival date & time: 03/12/24  1023      History   Chief Complaint Chief Complaint  Patient presents with   Mouth Lesions    I just discovered white spots on my tonsils & my tongue is coated with white fur looking patches - Entered by patient    HPI Dana Patel is a 53 y.o. female.  Patient presents with mouth and throat pain x 1 week.  She noted white patches on her throat and the roof of her mouth yesterday.  No OTC medications taken.  She has had congestion and postnasal drip x 1 week.  She attempted treatment with Flonase but states she was unable to tolerated as it dried her out too much.  She denies fever, cough, shortness of breath.  The history is provided by the patient and medical records.    Past Medical History:  Diagnosis Date   Anxiety    Family history of breast cancer    Head ache    Herpes genitalis    History of mammogram 04/2013; 05/18/15   neg; neg   History of Papanicolaou smear of cervix 12/14/10; 06/02/14   -/-; -/-    Patient Active Problem List   Diagnosis Date Noted   Migraine 01/29/2024   Generalized anxiety disorder with panic attacks 01/22/2024   Establishing care with new doctor, encounter for 01/22/2024   Chronic tension headaches 01/22/2024   Herpes genitalis 04/24/2017    Past Surgical History:  Procedure Laterality Date   APPENDECTOMY  2000   KNEE ARTHROSCOPY Left 1995   NOSE SURGERY     was in high school   OPEN REDUCTION INTERNAL FIXATION (ORIF) DISTAL RADIAL FRACTURE Left 08/24/2022   Procedure: OPEN REDUCTION INTERNAL FIXATION (ORIF) DISTAL RADIUS FRACTURE;  Surgeon: Rande Bushy, MD;  Location: [facility] ORS;  Service: Orthopedics;  Laterality: Left;    OB History     Gravida  0   Para  0   Term  0   Preterm  0   AB  0   Living  0      SAB  0   IAB  0   Ectopic  0   Multiple  0   Live Births  0            Home Medications    Prior to Admission medications    Medication Sig Start Date End Date Taking? Authorizing Provider  nystatin (MYCOSTATIN) 100000 UNIT/ML suspension Take 5 mLs (500,000 Units total) by mouth 4 (four) times daily. 03/12/24  Yes Wellington Half, NP  ALPRAZolam  (XANAX ) 0.25 MG tablet Take 1 tablet (0.25 mg total) by mouth daily as needed for anxiety. 02/02/24   Carlean Charter, DO  FLUoxetine  (PROZAC ) 20 MG capsule Take 1 capsule (20 mg total) by mouth daily. 02/02/24   Carlean Charter, DO  norethindrone-ethinyl estradiol-FE (JUNEL  FE 1/20) 1-20 MG-MCG tablet TAKE 1 TABLET BY MOUTH EVERY DAY , CONTNUOUS DOSING AS DIRECTED 05/21/23   Copland, Alicia B, PA-C  valACYclovir  (VALTREX ) 500 MG tablet TAKE 1 TABLET BY MOUTH TWICE A DAY FOR 3 DAYS AS NEEDED FOR SYMPTOMS, OR TAKE 1 DAILY FOR PREVENTION 06/20/23   Copland, Amada Jun, PA-C    Family History Family History  Problem Relation Age of Onset   Breast cancer Paternal Grandmother 27   Cancer Maternal Grandfather 49       brain, lung   Thyroid disease Mother  hypothyroidism   Hyperlipidemia Mother    Cancer Maternal Grandmother 25       stomach   Atrial fibrillation Father    Basal cell carcinoma Father    Hypertension Father    Healthy Sister    Stroke Paternal Grandfather     Social History Social History   Tobacco Use   Smoking status: Never   Smokeless tobacco: Never  Vaping Use   Vaping status: Never Used  Substance Use Topics   Alcohol use: Yes    Alcohol/week: 1.0 standard drink of alcohol    Types: 1 Glasses of wine per week    Comment: daily   Drug use: No     Allergies   Amoxicillin   Review of Systems Review of Systems  Constitutional:  Negative for chills and fever.  HENT:  Positive for congestion, postnasal drip and sore throat. Negative for ear pain, trouble swallowing and voice change.   Respiratory:  Negative for cough and shortness of breath.      Physical Exam Triage Vital Signs ED Triage Vitals  Encounter Vitals Group     BP  03/12/24 1038 126/83     Systolic BP Percentile --      Diastolic BP Percentile --      Pulse Rate 03/12/24 1038 92     Resp 03/12/24 1038 18     Temp 03/12/24 1038 98.7 F (37.1 C)     Temp Source 03/12/24 1038 Oral     SpO2 03/12/24 1038 97 %     Weight --      Height --      Head Circumference --      Peak Flow --      Pain Score 03/12/24 1041 4     Pain Loc --      Pain Education --      Exclude from Growth Chart --    No data found.  Updated Vital Signs BP 126/83 (BP Location: Left Arm)   Pulse 92   Temp 98.7 F (37.1 C) (Oral)   Resp 18   LMP 12/06/2020 (Approximate)   SpO2 97%   Visual Acuity Right Eye Distance:   Left Eye Distance:   Bilateral Distance:    Right Eye Near:   Left Eye Near:    Bilateral Near:     Physical Exam Constitutional:      General: She is not in acute distress. HENT:     Right Ear: Tympanic membrane normal.     Left Ear: Tympanic membrane normal.     Nose: Nose normal.     Mouth/Throat:     Mouth: Mucous membranes are moist.     Pharynx: Posterior oropharyngeal erythema present.     Comments: Posterior oropharynx and palate erythematous with white exudate. Cardiovascular:     Rate and Rhythm: Normal rate and regular rhythm.     Heart sounds: Normal heart sounds.  Pulmonary:     Effort: Pulmonary effort is normal. No respiratory distress.     Breath sounds: Normal breath sounds.  Neurological:     Mental Status: She is alert.      UC Treatments / Results  Labs (all labs ordered are listed, but only abnormal results are displayed) Labs Reviewed  POCT RAPID STREP A (OFFICE) - Normal    EKG   Radiology No results found.  Procedures Procedures (including critical care time)  Medications Ordered in UC Medications - No data to display  Initial Impression / Assessment  and Plan / UC Course  I have reviewed the triage vital signs and the nursing notes.  Pertinent labs & imaging results that were available during  my care of the patient were reviewed by me and considered in my medical decision making (see chart for details).    Oral thrush.  Afebrile and vital signs are stable.  Rapid strep negative.  Treating thrush with nystatin oral suspension x 10 days.  Education provided on thrush.  Instructed patient to follow up with her PCP if her symptoms are not improving.  She agrees to plan of care.  Final Clinical Impressions(s) / UC Diagnoses   Final diagnoses:  Oral thrush     Discharge Instructions      Use the nystatin oral suspension as directed (swish, gargle, swallow).  Follow-up with your primary care provider if your symptoms are not improving.    ED Prescriptions     Medication Sig Dispense Auth. Provider   nystatin (MYCOSTATIN) 100000 UNIT/ML suspension Take 5 mLs (500,000 Units total) by mouth 4 (four) times daily. 200 mL Wellington Half, NP      PDMP not reviewed this encounter.   Wellington Half, NP 03/12/24 1105

## 2024-03-17 ENCOUNTER — Ambulatory Visit: Admitting: Family Medicine

## 2024-03-24 ENCOUNTER — Encounter: Payer: Self-pay | Admitting: Obstetrics and Gynecology

## 2024-04-03 ENCOUNTER — Ambulatory Visit: Admitting: Obstetrics and Gynecology

## 2024-04-08 DIAGNOSIS — E538 Deficiency of other specified B group vitamins: Secondary | ICD-10-CM | POA: Diagnosis not present

## 2024-04-14 NOTE — Progress Notes (Unsigned)
 No chief complaint on file.    HPI:      Dana Patel is a 53 y.o. G0P0000 who LMP was Patient's last menstrual period was 12/06/2020 (approximate)., presents today for her annual examination. Her menses are absent now with cont dosing OCPs, Dysmenorrhea none, no BTB. Had headaches, bloating, and night sweats on placebo pills every cycle; sx resolved with cont dosing. Headaches improved since 10/23.  Went off pills a couple yrs ago and had heavy period. No vasomotor sx.   Sex activity: off and on,  contraception - OCP (estrogen/progesterone). No pain/bleeding/dryness. Last Pap: 12/16/20 Results were: no abnormalities /neg HPV DNA  Hx of STDs: HSV, takes valtrex  prn sx; sx triggered by stress.    Last mammogram: 01/31/24  Results were: normal--routine follow-up in 12 months.   There is a FH of breast cancer in her PGM, genetic testing not indicated. There is no FH of ovarian cancer. The patient does do self-breast exams.  Tobacco use: The patient denies current or previous tobacco use. Alcohol use: wknds No drug use. Exercise: very active   Colonoscopy: never; had neg FOBT through [facility]; interested in cologuard but didn't do last yr  She does get adequate calcium and Vitamin D in her diet.  Did fasting labs 5/24 with elevated LFTs. Pt taking tylenol  and daily valtrex  at the time. Repeat LFTs 03/12/23 improved but still slightly elevated. Neg Hep C. Stopped daily valtrex  about 9 days before repeat labs. Now takes sporadically. (Literature states valtrex  can increase LFTs). No prior labs to compare LFTs. Pt is not obese, alcohol on wknds only.    Past Medical History:  Diagnosis Date   Anxiety    Family history of breast cancer    Head ache    Herpes genitalis    History of mammogram 04/2013; 05/18/15   neg; neg   History of Papanicolaou smear of cervix 12/14/10; 06/02/14   -/-; -/-    Past Surgical History:  Procedure Laterality Date   APPENDECTOMY  2000   KNEE ARTHROSCOPY  Left 1995   NOSE SURGERY     was in high school   OPEN REDUCTION INTERNAL FIXATION (ORIF) DISTAL RADIAL FRACTURE Left 08/24/2022   Procedure: OPEN REDUCTION INTERNAL FIXATION (ORIF) DISTAL RADIUS FRACTURE;  Surgeon: Marchia Drivers, MD;  Location: [facility] ORS;  Service: Orthopedics;  Laterality: Left;    Family History  Problem Relation Age of Onset   Breast cancer Paternal Grandmother 31   Cancer Maternal Grandfather 51       brain, lung   Thyroid disease Mother        hypothyroidism   Hyperlipidemia Mother    Cancer Maternal Grandmother 74       stomach   Atrial fibrillation Father    Basal cell carcinoma Father    Hypertension Father    Healthy Sister    Stroke Paternal Grandfather     Social History   Socioeconomic History   Marital status: Single    Spouse name: Not on file   Number of children: 0   Years of education: 16   Highest education level: Bachelor's degree (e.g., BA, AB, BS)  Occupational History   Occupation: Set designer    Comment: [employer]  Tobacco Use   Smoking status: Never   Smokeless tobacco: Never  Vaping Use   Vaping status: Never Used  Substance and Sexual Activity   Alcohol use: Yes    Alcohol/week: 1.0 standard drink of alcohol    Types:  1 Glasses of wine per week    Comment: daily   Drug use: No   Sexual activity: Yes    Birth control/protection: Pill  Other Topics Concern   Not on file  Social History Narrative   Not on file   Social Drivers of Health   Financial Resource Strain: Low Risk  (01/18/2024)   Overall Financial Resource Strain (CARDIA)    Difficulty of Paying Living Expenses: Not hard at all  Food Insecurity: No Food Insecurity (01/18/2024)   Hunger Vital Sign    Worried About Running Out of Food in the Last Year: Never true    Ran Out of Food in the Last Year: Never true  Transportation Needs: No Transportation Needs (01/18/2024)   PRAPARE - Administrator, Civil Service (Medical): No    Lack of  Transportation (Non-Medical): No  Physical Activity: Sufficiently Active (01/18/2024)   Exercise Vital Sign    Days of Exercise per Week: 5 days    Minutes of Exercise per Session: 50 min  Stress: Stress Concern Present (01/18/2024)   [employer] of Occupational Health - Occupational Stress Questionnaire    Feeling of Stress : Very much  Social Connections: Socially Isolated (01/18/2024)   Social Connection and Isolation Panel    Frequency of Communication with Friends and Family: Once a week    Frequency of Social Gatherings with Friends and Family: Three times a week    Attends Religious Services: Never    Active Member of Clubs or Organizations: No    Attends Engineer, structural: Not on file    Marital Status: Never married  Intimate Partner Violence: Not on file     Current Outpatient Medications:    ALPRAZolam  (XANAX ) 0.25 MG tablet, Take 1 tablet (0.25 mg total) by mouth daily as needed for anxiety., Disp: 20 tablet, Rfl: 0   FLUoxetine  (PROZAC ) 20 MG capsule, Take 1 capsule (20 mg total) by mouth daily., Disp: 30 capsule, Rfl: 1   norethindrone-ethinyl estradiol-FE (JUNEL  FE 1/20) 1-20 MG-MCG tablet, TAKE 1 TABLET BY MOUTH EVERY DAY , CONTNUOUS DOSING AS DIRECTED, Disp: 63 tablet, Rfl: 3   nystatin  (MYCOSTATIN ) 100000 UNIT/ML suspension, Take 5 mLs (500,000 Units total) by mouth 4 (four) times daily., Disp: 200 mL, Rfl: 0   valACYclovir  (VALTREX ) 500 MG tablet, TAKE 1 TABLET BY MOUTH TWICE A DAY FOR 3 DAYS AS NEEDED FOR SYMPTOMS, OR TAKE 1 DAILY FOR PREVENTION, Disp: 30 tablet, Rfl: 5  ROS:  Review of Systems  Constitutional:  Negative for fatigue, fever and unexpected weight change.  Respiratory:  Negative for cough, shortness of breath and wheezing.   Cardiovascular:  Negative for chest pain, palpitations and leg swelling.  Gastrointestinal:  Negative for blood in stool, constipation, diarrhea, nausea and vomiting.  Endocrine: Negative for cold intolerance,  heat intolerance and polyuria.  Genitourinary:  Negative for dyspareunia, dysuria, flank pain, frequency, genital sores, hematuria, menstrual problem, pelvic pain, urgency, vaginal bleeding, vaginal discharge and vaginal pain.  Musculoskeletal:  Negative for back pain, joint swelling and myalgias.  Skin:  Negative for rash.  Neurological:  Negative for dizziness, syncope, light-headedness, numbness and headaches.  Hematological:  Negative for adenopathy.  Psychiatric/Behavioral:  Negative for agitation, confusion, sleep disturbance and suicidal ideas. The patient is not nervous/anxious.      Objective: LMP 12/06/2020 (Approximate)    Physical Exam Constitutional:      Appearance: She is well-developed.  Genitourinary:     Vulva normal.  Right Labia: No rash, tenderness or lesions.    Left Labia: No tenderness, lesions or rash.    No vaginal discharge, erythema or tenderness.      Right Adnexa: not tender and no mass present.    Left Adnexa: not tender and no mass present.    No cervical motion tenderness, friability or polyp.     Uterus is not enlarged or tender.  Breasts:    Right: No mass, nipple discharge, skin change or tenderness.     Left: No mass, nipple discharge, skin change or tenderness.  Neck:     Thyroid: No thyromegaly.  Cardiovascular:     Rate and Rhythm: Normal rate and regular rhythm.     Heart sounds: Normal heart sounds. No murmur heard. Pulmonary:     Effort: Pulmonary effort is normal.     Breath sounds: Normal breath sounds.  Abdominal:     Palpations: Abdomen is soft.     Tenderness: There is no abdominal tenderness. There is no guarding or rebound.  Musculoskeletal:        General: Normal range of motion.     Cervical back: Normal range of motion.  Lymphadenopathy:     Cervical: No cervical adenopathy.  Neurological:     General: No focal deficit present.     Mental Status: She is alert and oriented to person, place, and time.     Cranial  Nerves: No cranial nerve deficit.  Skin:    General: Skin is warm and dry.  Psychiatric:        Mood and Affect: Mood normal.        Behavior: Behavior normal.        Thought Content: Thought content normal.        Judgment: Judgment normal.  Vitals reviewed.     Assessment/Plan: Encounter for annual routine gynecological examination  Encounter for surveillance of contraceptive pills - Plan: norethindrone-ethinyl estradiol-FE (JUNEL  FE 1/20) 1-20 MG-MCG tablet; Rx RF eRxd. Doing well with cont dosing.  Encounter for screening mammogram for malignant neoplasm of breast; pt current on mammo  Screening for colon cancer - Plan: Cologuard; colonoscopy/cologuard discussed. Pt elects cologuard. Ref sent. Will f/u with results.  Elevated LFTs - Plan: Comprehensive metabolic panel, Hepatitis B surface antigen, Hepatitis B Surface AntiBODY; repeat labs in 3 months, rule out Hep B.              No orders of the defined types were placed in this encounter.   GYN counsel mammography screening, adequate intake of calcium and vitamin D, diet and exercise     F/U  No follow-ups on file.  Dana Renier B. Othman Masur, PA-C 04/14/2024 4:44 PM

## 2024-04-15 ENCOUNTER — Other Ambulatory Visit (HOSPITAL_COMMUNITY)
Admission: RE | Admit: 2024-04-15 | Discharge: 2024-04-15 | Disposition: A | Discharge status: Home / Self Care | Source: Ambulatory Visit | Attending: Obstetrics and Gynecology | Admitting: Obstetrics and Gynecology

## 2024-04-15 ENCOUNTER — Encounter: Payer: Self-pay | Admitting: Obstetrics and Gynecology

## 2024-04-15 ENCOUNTER — Ambulatory Visit: Admitting: Family Medicine

## 2024-04-15 ENCOUNTER — Ambulatory Visit (INDEPENDENT_AMBULATORY_CARE_PROVIDER_SITE_OTHER): Admitting: Obstetrics and Gynecology

## 2024-04-15 VITALS — BP 129/78 | HR 90 | Ht 71.0 in | Wt 172.0 lb

## 2024-04-15 DIAGNOSIS — Z01411 Encounter for gynecological examination (general) (routine) with abnormal findings: Secondary | ICD-10-CM | POA: Diagnosis not present

## 2024-04-15 DIAGNOSIS — Z1151 Encounter for screening for human papillomavirus (HPV): Secondary | ICD-10-CM | POA: Diagnosis not present

## 2024-04-15 DIAGNOSIS — Z124 Encounter for screening for malignant neoplasm of cervix: Secondary | ICD-10-CM | POA: Insufficient documentation

## 2024-04-15 DIAGNOSIS — Z1272 Encounter for screening for malignant neoplasm of vagina: Secondary | ICD-10-CM | POA: Diagnosis not present

## 2024-04-15 DIAGNOSIS — Z3041 Encounter for surveillance of contraceptive pills: Secondary | ICD-10-CM

## 2024-04-15 DIAGNOSIS — Z1211 Encounter for screening for malignant neoplasm of colon: Secondary | ICD-10-CM

## 2024-04-15 DIAGNOSIS — A6004 Herpesviral vulvovaginitis: Secondary | ICD-10-CM

## 2024-04-15 DIAGNOSIS — E538 Deficiency of other specified B group vitamins: Secondary | ICD-10-CM

## 2024-04-15 DIAGNOSIS — Z01419 Encounter for gynecological examination (general) (routine) without abnormal findings: Secondary | ICD-10-CM

## 2024-04-15 DIAGNOSIS — Z1231 Encounter for screening mammogram for malignant neoplasm of breast: Secondary | ICD-10-CM

## 2024-04-15 DIAGNOSIS — R7989 Other specified abnormal findings of blood chemistry: Secondary | ICD-10-CM

## 2024-04-15 MED ORDER — NORETHIN ACE-ETH ESTRAD-FE 1-20 MG-MCG PO TABS: ORAL_TABLET | ORAL | 3 refills | Status: DC

## 2024-04-15 MED ORDER — VALACYCLOVIR HCL 500 MG PO TABS: ORAL_TABLET | ORAL | 2 refills | Status: AC

## 2024-04-15 NOTE — Patient Instructions (Signed)
 I value your feedback and you entrusting [place] with your care. If you get a [place] patient survey, I would appreciate you taking the time to let us know about your experience today. Thank you! ? ? ?

## 2024-04-16 LAB — COMPREHENSIVE METABOLIC PANEL WITH GFR
ALT: 43 IU/L — ABNORMAL HIGH (ref 0–32)
AST: 61 IU/L — ABNORMAL HIGH (ref 0–40)
Albumin: 4.4 g/dL (ref 3.8–4.9)
Alkaline Phosphatase: 62 IU/L (ref 44–121)
BUN/Creatinine Ratio: 9 (ref 9–23)
BUN: 8 mg/dL (ref 6–24)
Bilirubin Total: 0.3 mg/dL (ref 0.0–1.2)
CO2: 16 mmol/L — ABNORMAL LOW (ref 20–29)
Calcium: 9.3 mg/dL (ref 8.7–10.2)
Chloride: 100 mmol/L (ref 96–106)
Creatinine, Ser: 0.85 mg/dL (ref 0.57–1.00)
Globulin, Total: 2.3 g/dL (ref 1.5–4.5)
Glucose: 75 mg/dL (ref 70–99)
Potassium: 3.9 mmol/L (ref 3.5–5.2)
Sodium: 136 mmol/L (ref 134–144)
Total Protein: 6.7 g/dL (ref 6.0–8.5)
eGFR: 82 mL/min/1.73 (ref >59)

## 2024-04-16 LAB — VITAMIN B12: Vitamin B-12: 866 pg/mL (ref 232–1245)

## 2024-04-17 ENCOUNTER — Ambulatory Visit: Payer: Self-pay | Admitting: Obstetrics and Gynecology

## 2024-04-17 LAB — CYTOLOGY - PAP: Diagnosis: NEGATIVE

## 2024-04-17 NOTE — Addendum Note (Signed)
 Addended by: WATT HILA B on: 04/17/2024 08:16 AM   Modules accepted: Orders

## 2024-04-28 ENCOUNTER — Ambulatory Visit
Admission: RE | Admit: 2024-04-28 | Discharge: 2024-04-28 | Disposition: A | Discharge status: Home / Self Care | Source: Ambulatory Visit | Attending: Obstetrics and Gynecology | Admitting: Obstetrics and Gynecology

## 2024-04-28 DIAGNOSIS — R7989 Other specified abnormal findings of blood chemistry: Secondary | ICD-10-CM | POA: Insufficient documentation

## 2024-04-30 ENCOUNTER — Encounter: Payer: Self-pay | Admitting: Family Medicine

## 2024-05-05 ENCOUNTER — Other Ambulatory Visit: Payer: Self-pay | Admitting: Family Medicine

## 2024-05-05 DIAGNOSIS — F410 Panic disorder [episodic paroxysmal anxiety] without agoraphobia: Secondary | ICD-10-CM

## 2024-05-05 NOTE — Addendum Note (Signed)
 Addended by: WATT HILA B on: 05/05/2024 05:21 PM   Modules accepted: Orders

## 2024-05-09 ENCOUNTER — Encounter: Payer: Self-pay | Admitting: Emergency Medicine

## 2024-05-09 ENCOUNTER — Ambulatory Visit: Payer: Self-pay

## 2024-05-09 ENCOUNTER — Ambulatory Visit
Admission: EM | Admit: 2024-05-09 | Discharge: 2024-05-09 | Disposition: A | Discharge status: Home / Self Care | Attending: Emergency Medicine | Admitting: Emergency Medicine

## 2024-05-09 DIAGNOSIS — R531 Weakness: Secondary | ICD-10-CM | POA: Diagnosis not present

## 2024-05-09 DIAGNOSIS — F411 Generalized anxiety disorder: Secondary | ICD-10-CM

## 2024-05-09 DIAGNOSIS — E162 Hypoglycemia, unspecified: Secondary | ICD-10-CM

## 2024-05-09 LAB — GLUCOSE, POCT (MANUAL RESULT ENTRY): POC Glucose: 108 mg/dL — AB (ref 70–99)

## 2024-05-09 NOTE — Telephone Encounter (Signed)
 FYI Only or Action Required?: FYI only for provider.  Patient was last seen in primary care on 01/22/2024 by Donzella Lauraine SAILOR, DO.  Called Nurse Triage reporting low bgl.  Symptoms began several weeks ago.  Interventions attempted: Dietary changes.  Symptoms are: unchanged.  Triage Disposition: No disposition on file.  Patient/caregiver understands and will follow disposition?: Yes     Copied from CRM [id number]. Topic: Clinical - Red Word Triage >> May 09, 2024  3:35 PM Carla L wrote: Red Word that prompted transfer to Nurse Triage: low blood sugar, feel weak and anxious, has drunk a coca cola Reason for Disposition . Patient sounds very sick or weak to the triager  Answer Assessment - Initial Assessment Questions 1. SYMPTOMS: What symptoms are you concerned about?     It feels like low bgl, not a lot of energy, states coming off glp1, states quit cold [place], headache 2. ONSET:  When did the symptoms start?     Two weeks ago 3. BLOOD GLUCOSE: What is your blood glucose level?      unknown 4. USUAL RANGE: What is your blood glucose level usually? (e.g., usual fasting morning value, usual evening value)     Not diabetic 5. TYPE 1 or 2:  Do you know what type of diabetes you have?  (e.g., Type 1, Type 2, Gestational; doesn't know)      na 6. INSULIN: Do you take insulin? What type of insulin(s) do you use? What is the mode of delivery? (syringe, pen; injection or pump) When did you last give yourself an insulin dose? (i.e., time or hours/minutes ago) How much did you give? (i.e., how many units)     na 7. DIABETES PILLS: Do you take any pills for your diabetes? If Yes, ask: What is the name of the medicine(s) that you take for high blood sugar?     na 8. OTHER SYMPTOMS: Do you have any symptoms? (e.g., fever, frequent urination, difficulty breathing, vomiting)     Hx anxiety 9. LOW BLOOD GLUCOSE TREATMENT: What have you done so far to treat the low  blood glucose level?     Drank coca cola 10. FOOD: When did you last eat or drink?       Just drank a coca cola 11. ALONE: Are you alone right now or is someone with you?        na 12. PREGNANCY: Is there any chance you are pregnant? When was your last menstrual period?       na  Protocols used: Diabetes - Low Blood Sugar-A-AH

## 2024-05-09 NOTE — Discharge Instructions (Addendum)
 Today you were evaluated for your general weakness, and feeling that your blood sugar is low  Your blood sugar today in clinic was 108 , which is normal  No neurological changes to indicate more serious cause of symptoms  I do believe that you that your body did not respond well to the tirzepatide which has also triggered your anxiety and at this time your anxiety is so high that it is making all of your symptoms worse  If needed take your Xanax  daily so that you are able to complete your day-to-day activities without feeling horrible until you see your primary doctor  Keep your upcoming appointment with your primary doctor for further management of your anxiety, if symptoms worsen before your upcoming appointment you have been given information to the behavioral health clinic which has a walk-in during the weekday

## 2024-05-09 NOTE — ED Triage Notes (Signed)
 Patient complains of low blood sugar and anxiety.

## 2024-05-09 NOTE — ED Provider Notes (Signed)
 Dana Patel    CSN: [id number] Arrival date & time: 05/09/24  1652      History   Chief Complaint Chief Complaint  Patient presents with   Anxiety   Hypoglycemia    HPI Dana Patel is a 53 y.o. female.   Patient presents for evaluation of generalized weakness, malaise, fatigue, decreased appetite and heightened anxiety present for 2 weeks after use of tirzepatide.  Has been taking semaglutide since February but changed medication due to headaches.  Patient has been discontinued, PCP aware.  Symptoms have been occurring daily since primarily in the mornings at least 1 to 2 hours after awakening, at times waking her out of her sleep.  Making it difficult to complete day-to-day activities.  Feels as if her blood sugar is low during times of events and therefore she has been taking spoonfuls of honey, glucose tablets, drinking soda and eating small bits of candy and fruit to keep levels elevated.  No history of diabetes.  Has been taking Xanax  to help manage anxiety and after use symptoms resolve, increased usage from baseline has taken 7 to 8 tablets within the last 14 days, usually can extend a 20 pill prescription for months.      Past Medical History:  Diagnosis Date   Anxiety    Family history of breast cancer    Head ache    Herpes genitalis    History of mammogram 04/2013; 05/18/15   neg; neg   History of Papanicolaou smear of cervix 12/14/10; 06/02/14   -/-; -/-    Patient Active Problem List   Diagnosis Date Noted   Migraine 01/29/2024   Generalized anxiety disorder with panic attacks 01/22/2024   Establishing care with new doctor, encounter for 01/22/2024   Chronic tension headaches 01/22/2024   Herpes genitalis 04/24/2017    Past Surgical History:  Procedure Laterality Date   APPENDECTOMY  2000   KNEE ARTHROSCOPY Left 1995   NOSE SURGERY     was in high school   OPEN REDUCTION INTERNAL FIXATION (ORIF) DISTAL RADIAL FRACTURE Left 08/24/2022   Procedure:  OPEN REDUCTION INTERNAL FIXATION (ORIF) DISTAL RADIUS FRACTURE;  Surgeon: Marchia Drivers, MD;  Location: [facility] OR[facility];  Service: Orthopedics;  Laterality: Left;    OB History     Gravida  0   Para  0   Term  0   Preterm  0   AB  0   Living  0      SAB  0   IAB  0   Ectopic  0   Multiple  0   Live Births  0            Home Medications    Prior to Admission medications   Medication Sig Start Date End Date Taking? Authorizing Provider  ALPRAZolam  (XANAX ) 0.25 MG tablet TAKE 1 TABLET BY MOUTH DAILY AS NEEDED FOR ANXIETY 05/05/24   Donzella Lauraine SAILOR, DO  norethindrone-ethinyl estradiol-FE (JUNEL  FE 1/20) 1-20 MG-MCG tablet TAKE 1 TABLET BY MOUTH EVERY DAY , CONTNUOUS DOSING AS DIRECTED 04/15/24   Copland, Alicia B, PA-C  ondansetron  (ZOFRAN ) 4 MG tablet Take 4 mg by mouth. 11/13/23   [provider]  SEMAGLUTIDE-WEIGHT MANAGEMENT [place] Inject into the skin.    [provider]  valACYclovir  (VALTREX ) 500 MG tablet TAKE 1 TABLET BY MOUTH TWICE A DAY FOR 3 DAYS AS NEEDED FOR SYMPTOMS, OR TAKE 1 DAILY FOR PREVENTION 04/15/24   Copland, Alicia B, PA-C  Family History Family History  Problem Relation Age of Onset   Breast cancer Paternal Grandmother 94   Cancer Maternal Grandfather 5       brain, lung   Thyroid disease Mother        hypothyroidism   Hyperlipidemia Mother    Cancer Maternal Grandmother 44       stomach   Atrial fibrillation Father    Basal cell carcinoma Father    Hypertension Father    Healthy Sister    Stroke Paternal Grandfather     Social History Social History   Tobacco Use   Smoking status: Never   Smokeless tobacco: Never  Vaping Use   Vaping status: Never Used  Substance Use Topics   Alcohol use: Yes    Alcohol/week: 1.0 standard drink of alcohol    Types: 1 Glasses of wine per week    Comment: occ   Drug use: No     Allergies   Amoxicillin   Review of Systems Review of Systems   Physical Exam Triage Vital  Signs ED Triage Vitals  Encounter Vitals Group     BP 05/09/24 1743 (!) 155/87     Girls Systolic BP Percentile --      Girls Diastolic BP Percentile --      Boys Systolic BP Percentile --      Boys Diastolic BP Percentile --      Pulse Rate 05/09/24 1743 (!) 112     Resp 05/09/24 1743 20     Temp 05/09/24 1743 98 F (36.7 C)     Temp Source 05/09/24 1743 Oral     SpO2 05/09/24 1743 98 %     Weight --      Height --      Head Circumference --      Peak Flow --      Pain Score 05/09/24 1744 0     Pain Loc --      Pain Education --      Exclude from Growth Chart --    No data found.  Updated Vital Signs BP (!) 155/87 (BP Location: Left Arm)   Pulse (!) 112   Temp 98 F (36.7 C) (Oral)   Resp 20   LMP 12/06/2020 (Approximate)   SpO2 98%   Visual Acuity Right Eye Distance:   Left Eye Distance:   Bilateral Distance:    Right Eye Near:   Left Eye Near:    Bilateral Near:     Physical Exam Constitutional:      Appearance: Normal appearance.  Eyes:     Extraocular Movements: Extraocular movements intact.     Conjunctiva/sclera: Conjunctivae normal.     Pupils: Pupils are equal, round, and reactive to light.  Pulmonary:     Effort: Pulmonary effort is normal.  Neurological:     General: No focal deficit present.     Mental Status: She is alert and oriented to person, place, and time. Mental status is at baseline.     Cranial Nerves: No cranial nerve deficit.     Sensory: No sensory deficit.     Motor: No weakness.     Coordination: Coordination normal.     Gait: Gait normal.      UC Treatments / Results  Labs (all labs ordered are listed, but only abnormal results are displayed) Labs Reviewed  GLUCOSE, POCT (MANUAL RESULT ENTRY) - Abnormal; Notable for the following components:      Result Value   POC  Glucose 108 (*)    All other components within normal limits    EKG   Radiology No results found.  Procedures Procedures (including critical care  time)  Medications Ordered in UC Medications - No data to display  Initial Impression / Assessment and Plan / UC Course  I have reviewed the triage vital signs and the nursing notes.  Pertinent labs & imaging results that were available during my care of the patient were reviewed by me and considered in my medical decision making (see chart for details).   Anxiety state, generalized weakness  Vital signs stable, patient while very anxious is in no signs of distress nor toxic appearing, point-of-care CBG is 108, no neurological deficits on exam, stable based on symptomology most likely medication reaction exacerbated by anxiety, at this time medication has been stopped for 12 days, has been taking Xanax  and it has been recommended for patient by her PCP that she starts on a daily anxiety medication which she is open to but concerned about side effects, at this time recommended continued use of her Xanax , may have to take daily until evaluated by her PCP has upcoming appointment on the 17th of this month, advised for any worsening symptoms to go to the nearest emergency department for full workup and evaluation  Final Clinical Impressions(s) / UC Diagnoses   Final diagnoses:  Generalized weakness  Anxiety state     Discharge Instructions      Today you were evaluated for your general weakness, and feeling that your blood sugar is low  Your blood sugar today in clinic was 108 , which is normal  No neurological changes to indicate more serious cause of symptoms  I do believe that you that your body did not respond well to the tirzepatide which has also triggered your anxiety and at this time your anxiety is so high that it is making all of your symptoms worse  If needed take your Xanax  daily so that you are able to complete your day-to-day activities without feeling horrible until you see your primary doctor  Keep your upcoming appointment with your primary doctor for further  management of your anxiety, if symptoms worsen before your upcoming appointment you have been given information to the behavioral health clinic which has a walk-in during the weekday   ED Prescriptions   None    PDMP not reviewed this encounter.   Teresa Shelba SAUNDERS, NP 05/09/24 [phone]

## 2024-05-10 ENCOUNTER — Encounter: Payer: Self-pay | Admitting: Intensive Care

## 2024-05-10 ENCOUNTER — Other Ambulatory Visit: Payer: Self-pay

## 2024-05-10 ENCOUNTER — Emergency Department: Admission: EM | Admit: 2024-05-10 | Discharge: 2024-05-10 | Disposition: A | Discharge status: Home / Self Care

## 2024-05-10 DIAGNOSIS — F419 Anxiety disorder, unspecified: Secondary | ICD-10-CM | POA: Diagnosis present

## 2024-05-10 DIAGNOSIS — R002 Palpitations: Secondary | ICD-10-CM | POA: Diagnosis not present

## 2024-05-10 DIAGNOSIS — F411 Generalized anxiety disorder: Secondary | ICD-10-CM | POA: Diagnosis not present

## 2024-05-10 LAB — CBG MONITORING, ED: Glucose-Capillary: 94 mg/dL (ref 70–99)

## 2024-05-10 MED ORDER — DIAZEPAM 5 MG PO TABS
5.0000 mg | ORAL_TABLET | Freq: Once | ORAL | Status: AC
  Administered 2024-05-10: 5 mg via ORAL
  Filled 2024-05-10: qty 1

## 2024-05-10 MED ORDER — DIAZEPAM 5 MG PO TABS: 5.0000 mg | ORAL_TABLET | Freq: Once | ORAL | 0 refills | Status: DC | PRN

## 2024-05-10 NOTE — ED Provider Notes (Signed)
 [facility] Provider Note    Event Date/Time   First MD Initiated Contact with Patient 05/10/24 1056     (approximate)   History   Anxiety   HPI  Dana Patel is a 53 y.o. female who presents with almost 2 weeks of progressively worsening symptoms of panic and feeling as if something bad would happen along with racing heart.  Patient states that she is followed by her primary care physician regularly and just had screening blood work completed in July and has had regular thyroid testing once a year which has been unremarkable.  Her primary care physician had placed her on a medication for prediabetes which she equates as the start of her symptoms.  This has been discontinued.  She states that she has followed up with her primary care physician in the interim and was prescribed Prozac  but has not been taking it as she is concerned about the potential for headaches with this medication. Instead she has been taking intermittently 0.25 mg of Xanax .  The Xanax  initially worked but has not worked over the past 48 hours.  She was seen at urgent care yesterday and was encouraged to follow-up with her primary care physician.  Her next primary care physician appointment is August 17.  She presents with her mother who helps contribute to the history.  Mother states there was a long history of anxiety disorders in her family.  Patient denies any suicidal ideation homicidal ideation.  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 and there were no small children in the house      Physical Exam   Triage Vital Signs: ED Triage Vitals  Encounter Vitals Group     BP 05/10/24 1018 (!) 152/105     Girls Systolic BP Percentile --      Girls Diastolic BP Percentile --      Boys Systolic BP Percentile --      Boys Diastolic BP Percentile --      Pulse Rate 05/10/24 1018 (!) 113     Resp 05/10/24 1018 20     Temp 05/10/24 1018 98.8 F (37.1 C)     Temp Source 05/10/24 1018 Oral     SpO2  05/10/24 1018 100 %     Weight 05/10/24 1019 166 lb (75.3 kg)     Height 05/10/24 1019 5' 11 (1.803 m)     Head Circumference --      Peak Flow --      Pain Score 05/10/24 1018 0     Pain Loc --      Pain Education --      Exclude from Growth Chart --     Most recent vital signs: Vitals:   05/10/24 1018  BP: (!) 152/105  Pulse: (!) 113  Resp: 20  Temp: 98.8 F (37.1 C)  SpO2: 100%    Nursing Triage Note reviewed. Vital signs reviewed and patients oxygen saturation is normoxic  General: Patient is well nourished, well developed, awake and alert, resting comfortably in no acute distress Head: Normocephalic and atraumatic Eyes: Normal inspection, extraocular muscles intact, no conjunctival pallor Ear, nose, throat: Normal external exam Neck: Normal range of motion Respiratory: Patient is in no respiratory distress, lungs CTAB Cardiovascular: Patient is not tachycardic while sitting in the chair in the room but was noted to be tachycardic in triage, RRR without murmur appreciated GI: Abd SNT with no guarding or rebound  Back: Normal inspection of the back  with good strength and range of motion throughout all ext Extremities: pulses intact with good cap refills, no LE pitting edema or calf tenderness Neuro: The patient is alert and oriented to person, place, and time, appropriately conversive, with 5/5 bilat UE/LE strength, no gross motor or sensory defects noted. Coordination appears to be adequate. Skin: Warm, dry, and intact Psych: Anxious mood and affect, no SI or HI  ED Results / Procedures / Treatments   Labs (all labs ordered are listed, but only abnormal results are displayed) Labs Reviewed  CBG MONITORING, ED     EKG EKG and rhythm strip are interpreted by myself:   EKG: [Normal sinus rhythm] at heart rate of 84, normal QRS duration, QTc 437,nonspecific ST segments and T waves no ectopy EKG not consistent with Acute STEMI Rhythm strip: NSR in lead  II   RADIOLOGY None  PROCEDURES:  Critical Care performed: No  Procedures   MEDICATIONS ORDERED IN ED: Medications  diazepam  (VALIUM ) tablet 5 mg (5 mg Oral Given 05/10/24 1203)     IMPRESSION / MDM / ASSESSMENT AND PLAN / ED COURSE                                Differential diagnosis includes, but is not limited to, organic psychiatric disorder, arrhythmia, hypoglycemia, less likely thyroid abnormality anemia given recent blood work and patient appears well perfused on presentation    ED course: Patient appears well-perfused today without any focal neurological deficits.  I reviewed the previous workup done in July 8.  I do not think she needs a repeat CBC or BMP given her symptoms and her physical exam.  Although she sounds to be in normal sinus rhythm on exam, patient requested an EKG and a point-of-care glucose which I am happy to oblige.  Patient is very hesitant to initiate Prozac  although this was recommended by her primary care physician.  I counseled that further benzo use can contribute to dependence and patient mother voiced understanding but would like something to break the cycle today.  Will give her a dose of Valium  which is slightly more longer acting.  Patient's mother will be the patient's ride home.  Will give behavioral health resources and will encourage close primary care physician follow-up    Clinical Course as of 05/10/24 1248  Sat May 10, 2024  1220 Glucose-Capillary: 94 Not elevated [HD]  1236 Patient reassessed and feels improved.  She is worried about tomorrow.  She and her mother adamantly request 1 extra dose of the benzodiazepine for tomorrow.  Counseled her that she should not take this with Xanax .  I also counseled her that I am worried about the possibility of dependency with further benzos but she states that she will keep on returning if she does not receive this.  I do think she has the capacity to make this decision at this time.  Encouraged  her to follow-up on Monday with our outpatient behavioral health resources [HD]    Clinical Course User Index [HD] Nicholaus Rolland BRAVO, MD   At time of discharge there is no evidence of acute life, limb, vision, or fertility threat. Patient has stable vital signs, pain is well controlled, patient is ambulatory and p.o. tolerant.  Discharge instructions were completed using the [employer] system. I would refer you to those at this time. All warnings prescriptions follow-up etc. were discussed in detail with the patient. Patient indicates understanding and  is agreeable with this plan. All questions answered.  Patient is made aware that they may return to the emergency department for any worsening or new condition or for any other emergency. --  Suggested E/M Coding Level: 4, 99284  This level has been selected based on the 07/01/2022 CPT guidelines for E/M codes in the Emergency Department based on 2/3 of the CoPA, Data, and Risk.  COPA: The patient has an acute illness with systemic symptoms: Anxiety prescription Risk: This patient has a moderate risk of morbidity as evidenced by the following further diagnostic testing or treatment actions: Prescription drug management   FINAL CLINICAL IMPRESSION(S) / ED DIAGNOSES   Final diagnoses:  Anxiety state  Palpitations     Rx / DC Orders   ED Discharge Orders          Ordered    diazepam  (VALIUM ) 5 MG tablet  Once PRN        05/10/24 1235             Note:  This document was prepared using Dragon voice recognition software and may include unintentional dictation errors.   Nicholaus Rolland BRAVO, MD 05/10/24 1248

## 2024-05-10 NOTE — ED Notes (Signed)
 Pt states she's ready to go home, discharge paperwork provided. Pt refused to stay for vitals

## 2024-05-10 NOTE — Discharge Instructions (Addendum)
 For the next 24 hours do not drive or operate machinery given the medication that we have given you.  I have prescribed a 1 rescue dose of Valium  for you for you tomorrow.  Please keep in mind this puts you at increased risk for benzodiazepine dependency which I am already worried about you about.  Please only take this medication should you absolutely require it.  Please do not take your Xanax  within 6 hours of taking this medication.  If you take this medication you should not drive or operate machinery again for another 24 hours. Please rest and ensure adequate hydration.  Try to cut down on any caffeine beverages.  Follow-up with your primary care physician --  RETURN PRECAUTIONS & AFTERCARE: (ENGLISH) RETURN PRECAUTIONS: Return immediately to the emergency department or see/call your doctor if you feel worse, weak or have changes in speech or vision, are short of breath, have fever, vomiting, pain, bleeding or dark stool, trouble urinating or any new issues. Return here or see/call your doctor if not improving as expected for your suspected condition. FOLLOW-UP CARE: Call your doctor and/or any doctors we referred you to for more advice and to make an appointment. Do this today, tomorrow or after the weekend. Some doctors only take PPO insurance so if you have HMO insurance you may want to contact your HMO or your regular doctor for referral to a specialist within your plan. Either way tell the doctor's office that it was a referral from the emergency department so you get the soonest possible appointment.  YOUR TEST RESULTS: Take result reports of any blood or urine tests, imaging tests and EKG's to your doctor and any referral doctor. Have any abnormal tests repeated. Your doctor or a referral doctor can let you know when this should be done. Also make sure your doctor contacts this hospital to get any test results that are not currently available such as cultures or special tests for infection and  final imaging reports, which are often not available at the time you leave the ER but which may list additional important findings that are not documented on the preliminary report. BLOOD PRESSURE: If your blood pressure was greater than 120/80 have your blood pressure rechecked within 1 to 2 weeks. MEDICATION SIDE EFFECTS: Do not drive, walk, bike, take the bus, etc. if you have received or are being prescribed any sedating medications such as those for pain or anxiety or certain antihistamines like Benadryl. If you have been give one of these here get a taxi home or have a friend drive you home. Ask your pharmacist to counsel you on potential side effects of any new medication

## 2024-05-10 NOTE — ED Triage Notes (Signed)
 Patient c/o anxiety. History same and takes small dose of xanax  for them. Reports recently the xanax  is not helping with her anxiety  A&O x4 in triage.   Denies CP or SOB. Denies pain

## 2024-05-11 ENCOUNTER — Encounter: Payer: Self-pay | Admitting: Family Medicine

## 2024-05-12 ENCOUNTER — Ambulatory Visit: Payer: Self-pay

## 2024-05-12 ENCOUNTER — Other Ambulatory Visit: Payer: Self-pay | Admitting: Family Medicine

## 2024-05-12 ENCOUNTER — Other Ambulatory Visit: Payer: Self-pay

## 2024-05-12 ENCOUNTER — Emergency Department
Admission: EM | Admit: 2024-05-12 | Discharge: 2024-05-12 | Discharge status: Against Medical Advice | Attending: Emergency Medicine | Admitting: Emergency Medicine

## 2024-05-12 DIAGNOSIS — Z5321 Procedure and treatment not carried out due to patient leaving prior to being seen by health care provider: Secondary | ICD-10-CM | POA: Diagnosis not present

## 2024-05-12 DIAGNOSIS — F419 Anxiety disorder, unspecified: Secondary | ICD-10-CM | POA: Diagnosis present

## 2024-05-12 DIAGNOSIS — E162 Hypoglycemia, unspecified: Secondary | ICD-10-CM

## 2024-05-12 MED ORDER — BLOOD GLUCOSE TEST VI STRP: 1.0000 | ORAL_STRIP | Freq: Two times a day (BID) | 3 refills | Status: DC | PRN

## 2024-05-12 MED ORDER — BLOOD GLUCOSE MONITORING SUPPL DEVI: 1.0000 | Freq: Two times a day (BID) | 0 refills | Status: DC | PRN

## 2024-05-12 MED ORDER — LANCET DEVICE MISC: 1.0000 | Freq: Two times a day (BID) | 0 refills | Status: DC | PRN

## 2024-05-12 MED ORDER — LANCETS MISC. MISC: 1.0000 | Freq: Two times a day (BID) | 3 refills | Status: DC | PRN

## 2024-05-12 NOTE — Telephone Encounter (Signed)
 FYI Only or Action Required?: Action required by provider: pt requesting sooner appt with PCP/alt provider if possible.  Patient was last seen in primary care on 01/22/2024 by Donzella Lauraine SAILOR, DO.  Called Nurse Triage reporting Hypertension.  Symptoms began several days ago.  Interventions attempted: Rest, hydration, or home remedies and Other: UC/ED.  Symptoms are: stable.  Triage Disposition: See Physician Within 24 Hours  Patient/caregiver understands and will follow disposition?: Yes                 Copied from CRM [id number]. Topic: Clinical - Red Word Triage >> May 12, 2024  9:05 AM Marissa P wrote: Red Word that prompted transfer to Nurse Triage: Patient had a blood pressure of 170/ 111 on Friday, anxiety attack, nothing is helping, sent a message to pcp no response, no help from anyone, consistent peeing, would like to be seen today if possible. Current symptoms: Zero Appetite and would like help right away please. Reason for Disposition  Systolic BP >= 180 OR Diastolic >= 110  Answer Assessment - Initial Assessment Questions 1. BLOOD PRESSURE: What is your blood pressure? Did you take at least two measurements 5 minutes apart?     157/88 prior to leaving UC 2. ONSET: When did you take your blood pressure?     Friday at [facility], seen in ED Saturday 3. HOW: How did you take your blood pressure? (e.g., automatic home BP monitor, visiting nurse)     UC reading 4. HISTORY: Do you have a history of high blood pressure?     Yes 5. MEDICINES: Are you taking any medicines for blood pressure? Have you missed any doses recently?     Xanax  0.25, recently restarted Lexapro  6. OTHER SYMPTOMS: Do you have any symptoms? (e.g., blurred vision, chest pain, difficulty breathing, headache, weakness)     Anxiety, reduced appetite  Protocols used: Blood Pressure - High-A-AH

## 2024-05-12 NOTE — Telephone Encounter (Signed)
 Noted

## 2024-05-12 NOTE — ED Triage Notes (Signed)
 Pt presents to the ED via POV with complaints of anxiety and flushed feeling while watching TV. Pt states that her HR went from 70s to 130s. She notes taking a Xanax  PTA which helped some with her sx but was concerned about the flushed feeling she was having.  A&Ox4 at this time. Denies CP or SOB.

## 2024-05-12 NOTE — Telephone Encounter (Signed)
 Patient has appointment tomorrow

## 2024-05-13 ENCOUNTER — Encounter: Payer: Self-pay | Admitting: Family Medicine

## 2024-05-13 ENCOUNTER — Ambulatory Visit (INDEPENDENT_AMBULATORY_CARE_PROVIDER_SITE_OTHER): Admitting: Family Medicine

## 2024-05-13 VITALS — BP 158/99 | HR 87 | Temp 97.5°F | Ht 71.0 in | Wt 169.2 lb

## 2024-05-13 DIAGNOSIS — F419 Anxiety disorder, unspecified: Secondary | ICD-10-CM

## 2024-05-13 DIAGNOSIS — R000 Tachycardia, unspecified: Secondary | ICD-10-CM | POA: Diagnosis not present

## 2024-05-13 DIAGNOSIS — R030 Elevated blood-pressure reading, without diagnosis of hypertension: Secondary | ICD-10-CM | POA: Diagnosis not present

## 2024-05-13 MED ORDER — CLONAZEPAM 0.5 MG PO TABS: 0.5000 mg | ORAL_TABLET | Freq: Two times a day (BID) | ORAL | 1 refills | Status: DC | PRN

## 2024-05-13 NOTE — Progress Notes (Signed)
 Acute Office Visit  Subjective:     Patient ID: Dana Patel, female    DOB: 03/08/1971, 53 y.o.   MRN: [id number]  Chief Complaint  Patient presents with   Anxiety    Patient presents with anxiety, states this restarted as she started on semiglutide. States she had anxiety most of her life but it seems to have worsened with taking the medication. States she switched from that to tirzeptide from an outside pharmacy company (not listed on med list) took one dose of this and states she had multiple panic attacks- never took this again. States she had another episode of anxiety and went to urgent care, blood pressure read high and heart rate was high.     Dana Patel is a 53 yo F with a history of anxiety who presents to the acutely to the clinic for anxiety and elevated blood pressure readings.  On interview, she reports that she has been struggling with her anxiety for the past few weeks. She reports having a history of anxiety and panic attacks, but has not had symptoms for years. She stated that her current symptoms started in 02/25 when she started taking semaglutide. Her symptoms worsened when she switched to tirazepatide in 07/25, prompting multiple ER visits, UC visits, EMS visits, and today's appointments. She reports that her baseline level of anxiety has been elevated with more frequent panic attacks that she cannot control. She reports not having had an appetite, but has been trying to eat and stay hydrated as best she can. She finds minimal relief with her current Xanax  prescription, only finding success with a Valium  she received in the ER. She denies any episodes of chest pain or shortness of breath, but states that during her panic attacks, her heart is racing and often has to control her breathing. She also endorses going through a difficult period in her personal life with a recent break up with a boyfriend of 20 years last month and stressful working conditions. She recently  restarted her Lexapro  which was prescribed in April. She would like to talk to someone today about getting her anxiety and panic attacks under better control.  Additionally, she reports concern regarding her elevated blood pressure and heart rate. She states that prior to this week, her previous readings have been at baseline for her. She states that her measurements have been elevated when she is feeling more anxious or on edge.  She reports no other concerns today.     ROS      Objective:    BP (!) 158/99 (BP Location: Left Arm, Patient Position: Sitting, Cuff Size: Normal)   Pulse 87   Temp (!) 97.5 F (36.4 C) (Oral)   Ht 5' 11 (1.803 m)   Wt 169 lb 3.2 oz (76.7 kg)   LMP 12/06/2020 (Approximate)   SpO2 98%   BMI 23.60 kg/m    Physical Exam Vitals reviewed.  Constitutional:      General: She is not in acute distress.    Appearance: Normal appearance. She is not ill-appearing or diaphoretic.  HENT:     Head: Normocephalic.     Right Ear: Tympanic membrane, ear canal and external ear normal.     Left Ear: Tympanic membrane, ear canal and external ear normal.     Nose: Nose normal.     Mouth/Throat:     Mouth: Mucous membranes are moist.     Pharynx: Oropharynx is clear. No oropharyngeal exudate or posterior oropharyngeal erythema.  Eyes:     General: No scleral icterus.    Conjunctiva/sclera: Conjunctivae normal.     Pupils: Pupils are equal, round, and reactive to light.  Cardiovascular:     Rate and Rhythm: Normal rate and regular rhythm.     Pulses: Normal pulses.     Heart sounds: Normal heart sounds. No murmur heard.    No friction rub. No gallop.  Pulmonary:     Effort: Pulmonary effort is normal. No respiratory distress.     Breath sounds: Normal breath sounds. No wheezing.  Abdominal:     General: There is no distension.     Palpations: Abdomen is soft.     Tenderness: There is no abdominal tenderness. There is no guarding.  Musculoskeletal:      Cervical back: Normal range of motion.     Right lower leg: No edema.     Left lower leg: No edema.  Lymphadenopathy:     Cervical: No cervical adenopathy.  Skin:    General: Skin is warm and dry.     Capillary Refill: Capillary refill takes less than 2 seconds.  Neurological:     Mental Status: She is alert and oriented to person, place, and time.  Psychiatric:        Mood and Affect: Mood is anxious.     No results found for any visits on 05/13/24.      Assessment & Plan:   Problem List Items Addressed This Visit   None Visit Diagnoses       Anxiety    -  Primary   Relevant Medications   escitalopram  (LEXAPRO ) 10 MG tablet   Other Relevant Orders   TSH   Ambulatory referral to Psychology     Elevated blood pressure reading without diagnosis of hypertension         Tachycardia          Anxiety Patient presents with worsening anxiety and increased frequency of panic attacks over the last week. Has visited the ER, UC, and clinic to help with her symptoms. Endorses experiencing a lot of work stress, personal stress with a break up, and increasing worry about her anxiety/blood pressure. Currently dose of Xanax  does not help her with panic attacks, but valium  at the ER did. Endorses recently restarting Lexapro  to help with elevated baseline of anxiety. - Continue Lexapro  10 mg daily - Start Klonopin  0.5 mg bid prn as bridge temporarily - d/c Xanax /Valium  - Ambulatory referral to psychology - Order TSH to rule out organic cause - Follow up as scheduled in the next two weeks with Dr. Donzella  Elevated blood pressure reading without diagnosis of hypertension Patient presents with concern about elevated blood pressure readings this week when visiting the ER, UC, and clinic. Prior to the onset of her current anxiety and panic attacks, her blood pressure readings have been within normal limits (at prior visits this year). Denies any symptoms of elevated blood pressure. Suspicion  that onset of elevated readings is linked to worsening anxiety and increased frequency of panic attacks. In office BP reading of 157/99. - Continue to monitor - Address underlying anxiety with Lexapro  10 mg daily - Order TSH   Meds ordered this encounter  Medications   clonazePAM  (KLONOPIN ) 0.5 MG tablet    Sig: Take 1 tablet (0.5 mg total) by mouth 2 (two) times daily as needed for anxiety.    Dispense:  30 tablet    Refill:  1    Return in about  2 weeks (around 05/27/2024) for anxiety and BP f/u with PCP, as scheduled.  Elia LULLA Blanch, Medical Student  Patient seen along with MS3 student, Elia Blanch. I personally evaluated this patient along with the student, and verified all aspects of the history, physical exam, and medical decision making as documented by the student. I agree with the student's documentation and have made all necessary edits.  Yuktha Kerchner, Jon HERO, MD, MPH [facility]

## 2024-05-15 ENCOUNTER — Ambulatory Visit: Payer: Self-pay | Admitting: Family Medicine

## 2024-05-15 LAB — TSH: TSH: 0.743 u[IU]/mL (ref 0.450–4.500)

## 2024-05-18 ENCOUNTER — Encounter: Payer: Self-pay | Admitting: Family Medicine

## 2024-05-23 ENCOUNTER — Other Ambulatory Visit: Payer: Self-pay | Admitting: Obstetrics and Gynecology

## 2024-05-23 DIAGNOSIS — Z3041 Encounter for surveillance of contraceptive pills: Secondary | ICD-10-CM

## 2024-05-26 ENCOUNTER — Encounter: Payer: Self-pay | Admitting: Family Medicine

## 2024-05-26 ENCOUNTER — Ambulatory Visit: Admitting: Family Medicine

## 2024-05-26 VITALS — BP 121/88 | Temp 98.1°F | Ht 71.0 in | Wt 171.0 lb

## 2024-05-26 DIAGNOSIS — F411 Generalized anxiety disorder: Secondary | ICD-10-CM | POA: Diagnosis not present

## 2024-05-26 DIAGNOSIS — F410 Panic disorder [episodic paroxysmal anxiety] without agoraphobia: Secondary | ICD-10-CM | POA: Diagnosis not present

## 2024-05-26 DIAGNOSIS — R7401 Elevation of levels of liver transaminase levels: Secondary | ICD-10-CM

## 2024-05-26 MED ORDER — ESCITALOPRAM OXALATE 10 MG PO TABS: 10.0000 mg | ORAL_TABLET | Freq: Every day | ORAL | 1 refills | Status: DC

## 2024-05-26 NOTE — Progress Notes (Signed)
 Established patient visit   Patient: Dana Patel   DOB: 1971/06/30   53 y.o. Female  MRN: [id number] Visit Date: 05/26/2024  Today's healthcare provider: LAURAINE LOISE BUOY, DO   Chief Complaint  Patient presents with   Medical Management of Chronic Issues    Patient presents to follow up on anxiety, reports still having anxiety. States she had a bad episode this past Friday night. Has been taking lexapro  for 2 weeks now.    Subjective    HPI Dana Patel is a 53 year old female with anxiety who presents with persistent anxiety and panic attacks.  In April, she was prescribed Lexapro  to try to moderate her anxiety.  However, she experienced a headache after the first dose and initially thought the Lexapro  was the source of this.  She discontinued the Lexapro  but did not start the replacement fluoxetine , out of concern for side effects.  In that time, she intermittently used her alprazolam , spreading 20 tablets over 3 months.  Unfortunately, her anxiety became worse, partially in association with the side effects she was experiencing related to compounded GLP-1 medications.  She has experienced several recent episodes of panic attacks with associated high blood pressure and heart rate, which led to visits to the ER and urgent care. These episodes were initially thought to be cardiac-related, but they were later determined to be likely related to anxiety. She established care with a cardiologist and wore a 7-day heart monitor, which she is returning today.  She has been under significant stress due to her job, which she describes as 'terrible' and 'stressful.' She attempted to switch from semaglutide to tirzepatide, which she believes worsened her anxiety and caused her to feel weak, leading to further panic. She stopped these medications after consulting with [facility].  Two weeks ago, after a visit with another provider here, she started taking Lexapro , having determined her symptoms  were due to the compounded GLP-1 medications, and was prescribed Klonopin  to manage her symptoms while waiting for the Lexapro  to take full effect. She feels 'okay' overall, with a few episodes of panic but generally doing well. She has been taking Klonopin  regularly, starting her day with a full tablet and sometimes taking a half tablet in the evening, and is trying to reduce her dosage gradually. She also has a history of using alprazolam  for panic attacks, which she typically used sparingly.  She has not been using the alprazolam  while taking the Klonopin  and is aware not to do so.  She has been consuming alcohol socially, which she believes may be contributing to her anxiety. She experienced heart racing and anxiety after drinking multiple days in a row, particularly when around a person who triggers her anxiety. She plans to limit her alcohol intake to one or two drinks during social events.  She reports improved sleep since starting Lexapro  and Klonopin , which she attributes to the medications. She has a history of headaches, which have decreased significantly since discontinuing semaglutide and tirzepatide.  Her liver function has been monitored due to elevated liver values, which have been stable over the past year. She underwent a liver ultrasound, which appeared normal, and was tested for hepatitis C, which was negative. She suspects her previous use of Valtrex  and ibuprofen may have contributed to the elevated liver values.      Medications: Outpatient Medications Prior to Visit  Medication Sig   clonazePAM  (KLONOPIN ) 0.5 MG tablet Take 1 tablet (0.5 mg total)  by mouth 2 (two) times daily as needed for anxiety.   norethindrone-ethinyl estradiol-FE (JUNEL  FE 1/20) 1-20 MG-MCG tablet TAKE 1 TABLET BY MOUTH EVERY DAY , CONTNUOUS DOSING AS DIRECTED   ondansetron  (ZOFRAN ) 4 MG tablet Take 4 mg by mouth.   valACYclovir  (VALTREX ) 500 MG tablet TAKE 1 TABLET BY MOUTH TWICE A DAY FOR 3 DAYS AS  NEEDED FOR SYMPTOMS, OR TAKE 1 DAILY FOR PREVENTION   [DISCONTINUED] escitalopram  (LEXAPRO ) 10 MG tablet Take 10 mg by mouth daily.   No facility-administered medications prior to visit.        Objective    BP 121/88 (BP Location: Left Arm, Patient Position: Sitting, Cuff Size: Normal)   Temp 98.1 F (36.7 C) (Oral)   Ht 5' 11 (1.803 m)   Wt 171 lb (77.6 kg)   LMP 12/06/2020 (Approximate)   SpO2 98%   BMI 23.85 kg/m     Physical Exam Vitals and nursing note reviewed.  Constitutional:      General: She is not in acute distress.    Appearance: Normal appearance.  HENT:     Head: Normocephalic and atraumatic.  Eyes:     General: No scleral icterus.    Conjunctiva/sclera: Conjunctivae normal.  Cardiovascular:     Rate and Rhythm: Normal rate.  Pulmonary:     Effort: Pulmonary effort is normal.  Neurological:     Mental Status: She is alert and oriented to person, place, and time. Mental status is at baseline.  Psychiatric:        Mood and Affect: Mood normal.        Behavior: Behavior normal.      No results found for any visits on 05/26/24.  Assessment & Plan    Generalized anxiety disorder with panic attacks -     Escitalopram  Oxalate; Take 1 tablet (10 mg total) by mouth daily.  Dispense: 30 tablet; Refill: 1  Transaminitis -     Hepatitis B surface antibody,qualitative -     Hepatitis B surface antigen -     Hepatitis A antibody, total -     Hepatitis A antibody, IgM -     Hepatic function panel     Generalized anxiety disorder with panic attacks Generalized anxiety disorder with recent exacerbation of panic attacks. Anxiety severe, leading to ER visits. Lexapro  started with some improvement. Klonopin  prescribed temporarily. Possible triggers include GLP-1 medications, stress, and alcohol. Cardiologist involved; heart monitor results pending. Alcohol may worsen symptoms. Lexapro  dose effective, monitoring for improvement over 4-6 weeks. Klonopin  tapering  as Lexapro  takes effect. - Continue Lexapro  10 mg daily. - Continue Klonopin  as needed, taper as Lexapro  becomes more effective. - Avoid alcohol or limit to 1-2 drinks. - Follow up with cardiologist for heart monitor results. - Consider behavioral therapy. - Schedule follow-up in 4 weeks.  Transaminitis, stable Elevated liver enzymes stable over the past year. Recent liver ultrasound normal. Possible medication-related elevation, with Valtrex  and alcohol as contributors. Hepatitis C negative; hepatitis A and B not tested. - Order hepatitis A and B testing. - Patient has been off Valtrex  for 1 month; will recheck recheck liver function tests today.    Return in about 4 weeks (around 06/23/2024) for Anx/Dep recheck.      I discussed the assessment and treatment plan with the patient  The patient was provided an opportunity to ask questions and all were answered. The patient agreed with the plan and demonstrated an understanding of the instructions.   The patient  was advised to call back or seek an in-person evaluation if the symptoms worsen or if the condition fails to improve as anticipated.    LAURAINE LOISE BUOY, DO  [facility] [phone] (phone) [phone] (fax)  [facility]

## 2024-06-19 ENCOUNTER — Other Ambulatory Visit: Payer: Self-pay | Admitting: Family Medicine

## 2024-06-19 DIAGNOSIS — F410 Panic disorder [episodic paroxysmal anxiety] without agoraphobia: Secondary | ICD-10-CM

## 2024-07-02 ENCOUNTER — Encounter: Payer: Self-pay | Admitting: Family Medicine

## 2024-07-02 ENCOUNTER — Ambulatory Visit: Admitting: Family Medicine

## 2024-07-02 VITALS — BP 128/82 | HR 75 | Ht 71.0 in | Wt 180.2 lb

## 2024-07-02 DIAGNOSIS — F411 Generalized anxiety disorder: Secondary | ICD-10-CM

## 2024-07-02 DIAGNOSIS — F410 Panic disorder [episodic paroxysmal anxiety] without agoraphobia: Secondary | ICD-10-CM

## 2024-07-02 DIAGNOSIS — M5481 Occipital neuralgia: Secondary | ICD-10-CM

## 2024-07-02 MED ORDER — ESCITALOPRAM OXALATE 10 MG PO TABS: 10.0000 mg | ORAL_TABLET | Freq: Every day | ORAL | 0 refills | Status: DC

## 2024-07-02 NOTE — Progress Notes (Signed)
 Established patient visit   Patient: Dana Patel   DOB: [birth year]   53 y.o. Female  MRN: [id number] Visit Date: 07/02/2024  Today's healthcare provider: LAURAINE LOISE BUOY, DO   Chief Complaint  Patient presents with   Anxiety    Follow-up  Patient declined pneumonia vaccine   Subjective    Anxiety    Dana Patel is a 53 year old female who presents for follow-up of anxiety and headache management.  She has experienced significant improvement in her anxiety symptoms on Lexapro  10 mg daily. She feels less anxious and finds comfort in having Klonopin  and alprazolam  available as needed, though she has not used them recently (within the past two weeks). She carries these medications for peace of mind, especially when traveling.  She experiences occasional headaches, which have been managed with occipital nerve blocks since May. The headaches are not fully back but occur occasionally. She is scheduled for another occipital nerve block on October 8th with Dr. Maree.  Her current medications include Lexapro  10 mg daily. She has a supply of Klonopin  and alprazolam , which she uses sparingly, with an estimated 10-12 Klonopin  tablets and 5-6 alprazolam  tablets remaining.  She has declined the pneumonia and shingles vaccines in the past but is reconsidering the shingles vaccine after a friend's experience with postherpetic neuralgia. She has had chickenpox in the past.       Medications: Outpatient Medications Prior to Visit  Medication Sig   ALPRAZolam  (NIRAVAM ) 0.25 MG dissolvable tablet Take 0.25 mg by mouth at bedtime as needed for anxiety.   clonazePAM  (KLONOPIN ) 0.5 MG tablet Take 1 tablet (0.5 mg total) by mouth 2 (two) times daily as needed for anxiety.   norethindrone-ethinyl estradiol-FE (JUNEL  FE 1/20) 1-20 MG-MCG tablet TAKE 1 TABLET BY MOUTH EVERY DAY , CONTNUOUS DOSING AS DIRECTED   ondansetron  (ZOFRAN ) 4 MG tablet Take 4 mg by mouth.   valACYclovir  (VALTREX ) 500  MG tablet TAKE 1 TABLET BY MOUTH TWICE A DAY FOR 3 DAYS AS NEEDED FOR SYMPTOMS, OR TAKE 1 DAILY FOR PREVENTION   [DISCONTINUED] escitalopram  (LEXAPRO ) 10 MG tablet TAKE 1 TABLET BY MOUTH EVERY DAY   No facility-administered medications prior to visit.        Objective    BP 128/82 (BP Location: Left Arm, Patient Position: Sitting, Cuff Size: Normal)   Pulse 75   Ht 5' 11 (1.803 m)   Wt 180 lb 3.2 oz (81.7 kg)   LMP 12/06/2020 (Approximate)   SpO2 99%   BMI 25.13 kg/m     Physical Exam Vitals and nursing note reviewed.  Constitutional:      General: She is not in acute distress.    Appearance: Normal appearance.  HENT:     Head: Normocephalic and atraumatic.  Eyes:     General: No scleral icterus.    Conjunctiva/sclera: Conjunctivae normal.  Cardiovascular:     Rate and Rhythm: Normal rate.  Pulmonary:     Effort: Pulmonary effort is normal.  Neurological:     Mental Status: She is alert and oriented to person, place, and time. Mental status is at baseline.  Psychiatric:        Mood and Affect: Mood normal.        Behavior: Behavior normal.      No results found for any visits on 07/02/24.  Assessment & Plan    Generalized anxiety disorder with panic attacks -     Escitalopram  Oxalate; Take 1  tablet (10 mg total) by mouth daily.  Dispense: 90 tablet; Refill: 0  Bilateral occipital neuralgia      Generalized anxiety disorder with panic attacks Conditions well-managed with escitalopram  10 mg daily. Tapered off clonazepam , uses alprazolam  as needed. Reports reduced anxiety and satisfaction with regimen. No dosage increase needed. - Continue escitalopram  10 mg daily. - Prescribe additional escitalopram  for next refill. - Maintain current prescriptions for clonazepam  and alprazolam  as needed. - Schedule follow-up in 6 months, with virtual visit option.  Bilateral occipital neuralgia Bilateral occipital neuralgia, controlled, with occasional episodes.  Scheduled for occipital nerve block with Dr. Maree on October 8th, previously effective. - Proceed with scheduled occipital nerve block on October 8th.  Defer to specialist management.  General Health Maintenance Declined pneumonia vaccine. Considering shingles vaccine after discussing risks and benefits. Has Cologuard kit for colorectal cancer screening. - Encourage receiving shingles vaccine and Tdap, available at pharmacies. - Complete and send Cologuard test for colorectal cancer screening. - Complete blood work as previously ordered.    Return in about 6 months (around 12/30/2024) for Chronic f/u.      I discussed the assessment and treatment plan with the patient  The patient was provided an opportunity to ask questions and all were answered. The patient agreed with the plan and demonstrated an understanding of the instructions.   The patient was advised to call back or seek an in-person evaluation if the symptoms worsen or if the condition fails to improve as anticipated.    LAURAINE LOISE BUOY, DO  [facility] [phone] (phone) [phone] (fax)  [facility]

## 2024-07-02 NOTE — Patient Instructions (Addendum)
 Recommended  vaccines: Shingrix (for shingles) and Tdap (for tetanus, diphtheria, and pertussis)

## 2024-07-09 ENCOUNTER — Encounter: Payer: Self-pay | Admitting: Family Medicine

## 2024-07-09 DIAGNOSIS — F410 Panic disorder [episodic paroxysmal anxiety] without agoraphobia: Secondary | ICD-10-CM

## 2024-07-18 MED ORDER — ESCITALOPRAM OXALATE 10 MG PO TABS: 15.0000 mg | ORAL_TABLET | Freq: Every day | ORAL | 0 refills | Status: DC

## 2024-07-22 ENCOUNTER — Other Ambulatory Visit: Payer: Self-pay | Admitting: Family Medicine

## 2024-07-22 DIAGNOSIS — F410 Panic disorder [episodic paroxysmal anxiety] without agoraphobia: Secondary | ICD-10-CM

## 2024-07-22 NOTE — Telephone Encounter (Unsigned)
 Copied from CRM #[id number]. Topic: Clinical - Prescription Issue >> Jul 22, 2024  3:28 PM Zebedee SAUNDERS wrote: Reason for CRM:  Pt stated Dr. Donzella is to increase medication to 15 mg of escitalopram  (LEXAPRO ) 10 MG tablet but since they don't have a 15 mg tablet, please send 5mg  increase to pharmacy [facility] [address] [place] - [address] [place] [address] Phone: [phone] Fax: [phone]. Pt wants a call back regarding the 5 mg increase.

## 2024-07-23 ENCOUNTER — Ambulatory Visit: Payer: Self-pay | Admitting: Family Medicine

## 2024-07-23 NOTE — Telephone Encounter (Signed)
 Copied from CRM #[id number]. Topic: Clinical - Prescription Issue >> Jul 23, 2024 12:06 PM Joesph NOVAK wrote: Reason for CRM: patient is expecting a refill to be sent to her pharmacy. She states her provider still has not sent it over and she is concerned. Can someone follow up with this patient?   please send 5mg  LEXAPRO  increase to pharmacy [facility] [address] [place] [facility] [address] [place] [address]

## 2024-07-24 ENCOUNTER — Other Ambulatory Visit: Payer: Self-pay | Admitting: Family Medicine

## 2024-07-24 DIAGNOSIS — F410 Panic disorder [episodic paroxysmal anxiety] without agoraphobia: Secondary | ICD-10-CM

## 2024-07-24 MED ORDER — ESCITALOPRAM OXALATE 10 MG PO TABS: 10.0000 mg | ORAL_TABLET | Freq: Every day | ORAL | 1 refills | Status: DC

## 2024-07-24 MED ORDER — ESCITALOPRAM OXALATE 5 MG PO TABS
5.0000 mg | ORAL_TABLET | Freq: Every day | ORAL | 1 refills | Status: DC
Start: 2024-07-24 — End: 2024-08-18

## 2024-07-24 MED ORDER — CLONAZEPAM 0.5 MG PO TABS: 0.5000 mg | ORAL_TABLET | Freq: Two times a day (BID) | ORAL | 1 refills | Status: AC | PRN

## 2024-07-24 NOTE — Telephone Encounter (Signed)
 Copied from CRM [id number]. Topic: Clinical - Prescription Issue >> Jul 23, 2024  4:44 PM Alfonso HERO wrote: Reason for CRM: patient calling back again in regards to refill request. Can someone please call her back?

## 2024-07-31 NOTE — Telephone Encounter (Signed)
 RICK GLENWOOD Domino re: outreach  CMAs - please offer her appt with PCP for the time being. Domino is going to work with her on next steps regarding her desire to switch PCPs.

## 2024-08-05 NOTE — Telephone Encounter (Signed)
 Spoke with patient regarding desire to switch.  Made appt for [facility] on 09/15/24 - pt will reach out if appt is needed sooner.

## 2024-08-06 ENCOUNTER — Other Ambulatory Visit: Payer: Self-pay | Admitting: Medical Genetics

## 2024-08-06 DIAGNOSIS — Z006 Encounter for examination for normal comparison and control in clinical research program: Secondary | ICD-10-CM

## 2024-08-12 NOTE — Telephone Encounter (Signed)
 Please see if there is any availability to move her 12/8 appt scheduled with me up. We need to have a visit to discuss this change in medication.

## 2024-08-18 ENCOUNTER — Encounter: Payer: Self-pay | Admitting: Family Medicine

## 2024-08-18 ENCOUNTER — Ambulatory Visit: Admitting: Family Medicine

## 2024-08-18 VITALS — BP 152/88 | HR 75 | Temp 98.2°F | Ht 71.0 in | Wt 186.4 lb

## 2024-08-18 DIAGNOSIS — R030 Elevated blood-pressure reading, without diagnosis of hypertension: Secondary | ICD-10-CM

## 2024-08-18 DIAGNOSIS — M7712 Lateral epicondylitis, left elbow: Secondary | ICD-10-CM | POA: Diagnosis not present

## 2024-08-18 DIAGNOSIS — A6004 Herpesviral vulvovaginitis: Secondary | ICD-10-CM

## 2024-08-18 DIAGNOSIS — F410 Panic disorder [episodic paroxysmal anxiety] without agoraphobia: Secondary | ICD-10-CM

## 2024-08-18 DIAGNOSIS — F411 Generalized anxiety disorder: Secondary | ICD-10-CM | POA: Diagnosis not present

## 2024-08-18 MED ORDER — WEGOVY 0.25 MG/0.5ML ~~LOC~~ SOAJ: 0.2500 mg | SUBCUTANEOUS | 0 refills | Status: DC

## 2024-08-18 MED ORDER — WEGOVY 0.5 MG/0.5ML ~~LOC~~ SOAJ: 0.5000 mg | SUBCUTANEOUS | 1 refills | Status: DC

## 2024-08-18 MED ORDER — SERTRALINE HCL 50 MG PO TABS: 50.0000 mg | ORAL_TABLET | Freq: Every day | ORAL | 3 refills | Status: DC

## 2024-08-18 NOTE — Assessment & Plan Note (Signed)
 Managed with Valtrex  as needed, particularly during periods of stress or sexual activity. - Continue Valtrex  as needed for herpes outbreaks.

## 2024-08-18 NOTE — Assessment & Plan Note (Signed)
 Managed with Lexapro , but she reports emotional flatness, dry mouth, and weight gain. Considering switching to Zoloft due to side effects and lack of efficacy at higher doses. Zoloft is preferred for its broader dose range and potential for fewer side effects. Discussed potential for jitteriness and the need for a gradual transition. Zoloft may take up to six weeks to show full effect. - Switched from Lexapro  to Zoloft 50 mg daily. - Instructed to monitor for side effects and efficacy over six weeks. - Continue Klonopin  as needed for acute anxiety. - Scheduled follow-up in six weeks to assess response to Zoloft.

## 2024-08-18 NOTE — Progress Notes (Signed)
 Establish visit   Patient: Dana Patel   DOB: [birth year]   53 y.o. Female  MRN: [id number] Visit Date: 08/18/2024  Today's healthcare provider: Jon Eva, MD   Chief Complaint  Patient presents with   Establish Care    Patient presents to establish care with new pcp.  Diet Exercise  Concerns: dry mouth, is concerned this is caused by lexapro .  Has been taking 12.5 instead of 15mg  lexapro . Would like to discuss possibly switching to zoloft  Concerns with bruising easily.    Subjective    Dana LEVEN is a 53 y.o. female who presents today as a new patient to establish care.   Discussed the use of AI scribe software for clinical note transcription with the patient, who gave verbal consent to proceed.  History of Present Illness   Dana Patel is a 53 year old female with anxiety who presents with concerns about anxiety management and medication side effects.  She experiences increased anxiety on Lexapro , initially effective but now causing evening anxiety. Increasing the dose to 15 mg worsened anxiety and caused a 'flat' emotional state. She also has significant dry mouth, leading to gagging and vomiting. Increased bruising occurs since starting Lexapro , particularly after minor injuries, raising concerns about its effect on platelet function.  She uses semaglutide for weight management, initially with significant weight loss. After stopping and restarting, she has not achieved the same results and is currently on a 0.4 mg dose.  She has migraines and occipital neuralgia, with recent pain after pulling a heavy object. She uses Tiger Balm, a neck massager, and heating pads for relief. Elbow pain occurs with certain movements and activities.      Starting BMI 29.4     Past Medical History:  Diagnosis Date   Anxiety Most recent 10/10/23   Past history in chart but recently returned   Family history of breast cancer    Head ache    Herpes genitalis    Past  Surgical History:  Procedure Laterality Date   APPENDECTOMY  2000   FRACTURE SURGERY  08/24/22   Wrist fracture repair   KNEE ARTHROSCOPY Left 1995   NOSE SURGERY     was in high school   OPEN REDUCTION INTERNAL FIXATION (ORIF) DISTAL RADIAL FRACTURE Left 08/24/2022   Procedure: OPEN REDUCTION INTERNAL FIXATION (ORIF) DISTAL RADIUS FRACTURE;  Surgeon: Marchia Drivers, MD;  Location: [facility] ORS;  Service: Orthopedics;  Laterality: Left;   Family Status  Relation Name Status   Mother Dayzee Trower Alive   Father [address] Alive   Sister  Alive   Bernie  Deceased   MGF Carlin Simpers Deceased   PGM  Deceased   PGF Merrell Hurst Sr Deceased  No partnership data on file   Family History  Problem Relation Age of Onset   Hyperlipidemia Mother    Anxiety disorder Mother    Skin cancer Mother    Atrial fibrillation Father    Basal cell carcinoma Father    Hypertension Father    Healthy Sister    Cancer Maternal Grandmother 75       stomach   Cancer Maternal Grandfather 75       brain, lung   Breast cancer Paternal Grandmother 90   Stroke Paternal Grandfather    Social History   Socioeconomic History   Marital status: Single    Spouse name: Not on file   Number of children: 0   Years of education: 1  Highest education level: Bachelor's degree (e.g., BA, AB, BS)  Occupational History   Occupation: set designer    Comment: [employer]  Tobacco Use   Smoking status: Never   Smokeless tobacco: Never  Vaping Use   Vaping status: Never Used  Substance and Sexual Activity   Alcohol use: Yes    Alcohol/week: 2.0 standard drinks of alcohol    Types: 2 Glasses of wine per week   Drug use: No   Sexual activity: Not Currently    Birth control/protection: Pill  Other Topics Concern   Not on file  Social History Narrative   Not on file   Social Drivers of Health   Financial Resource Strain: Low Risk  (08/14/2024)   Overall Financial Resource Strain (CARDIA)    Difficulty  of Paying Living Expenses: Not hard at all  Food Insecurity: No Food Insecurity (08/14/2024)   Hunger Vital Sign    Worried About Running Out of Food in the Last Year: Never true    Ran Out of Food in the Last Year: Never true  Transportation Needs: No Transportation Needs (08/14/2024)   PRAPARE - Administrator, Civil Service (Medical): No    Lack of Transportation (Non-Medical): No  Physical Activity: Sufficiently Active (08/14/2024)   Exercise Vital Sign    Days of Exercise per Week: 4 days    Minutes of Exercise per Session: 50 min  Stress: No Stress Concern Present (08/14/2024)   [employer] of Occupational Health - Occupational Stress Questionnaire    Feeling of Stress: Only a little  Social Connections: Socially Isolated (08/14/2024)   Social Connection and Isolation Panel    Frequency of Communication with Friends and Family: Never    Frequency of Social Gatherings with Friends and Family: More than three times a week    Attends Religious Services: Never    Database Administrator or Organizations: No    Attends Engineer, Structural: Not on file    Marital Status: Never married   Outpatient Medications Prior to Visit  Medication Sig   clonazePAM  (KLONOPIN ) 0.5 MG tablet Take 1 tablet (0.5 mg total) by mouth 2 (two) times daily as needed for anxiety.   norethindrone-ethinyl estradiol-FE (JUNEL  FE 1/20) 1-20 MG-MCG tablet TAKE 1 TABLET BY MOUTH EVERY DAY , CONTNUOUS DOSING AS DIRECTED   ondansetron  (ZOFRAN ) 4 MG tablet Take 4 mg by mouth.   valACYclovir  (VALTREX ) 500 MG tablet TAKE 1 TABLET BY MOUTH TWICE A DAY FOR 3 DAYS AS NEEDED FOR SYMPTOMS, OR TAKE 1 DAILY FOR PREVENTION   [DISCONTINUED] ALPRAZolam  (NIRAVAM ) 0.25 MG dissolvable tablet Take 0.25 mg by mouth at bedtime as needed for anxiety.   [DISCONTINUED] escitalopram  (LEXAPRO ) 10 MG tablet Take 1 tablet (10 mg total) by mouth daily. To be taken with escitalopram  5 mg (total = 15 mg daily).    [DISCONTINUED] escitalopram  (LEXAPRO ) 5 MG tablet Take 1 tablet (5 mg total) by mouth daily. To be taken with escitalopram  10 mg (total = 15 mg daily). (Patient taking differently: Take 2.5 mg by mouth daily. To be taken with escitalopram  10 mg (total = 15 mg daily).)   No facility-administered medications prior to visit.   Allergies  Allergen Reactions   Amoxicillin Hives    Review of Systems      Objective    BP (!) 152/88   Pulse 75   Temp 98.2 F (36.8 C) (Oral)   Ht 5' 11 (1.803 m)   Wt 186 lb  6.4 oz (84.6 kg)   LMP 12/06/2020 (Approximate)   SpO2 100%   BMI 26.00 kg/m     Physical Exam Vitals reviewed.  Constitutional:      General: She is not in acute distress.    Appearance: Normal appearance. She is well-developed. She is not diaphoretic.  HENT:     Head: Normocephalic and atraumatic.  Eyes:     General: No scleral icterus.    Conjunctiva/sclera: Conjunctivae normal.  Neck:     Thyroid: No thyromegaly.  Cardiovascular:     Rate and Rhythm: Normal rate and regular rhythm.     Heart sounds: Normal heart sounds. No murmur heard. Pulmonary:     Effort: Pulmonary effort is normal. No respiratory distress.     Breath sounds: Normal breath sounds. No wheezing, rhonchi or rales.  Musculoskeletal:     Cervical back: Neck supple.     Right lower leg: No edema.     Left lower leg: No edema.     Comments: TTP over L lateral epicondyle, pain with resisted pronation. ROM intact  Lymphadenopathy:     Cervical: No cervical adenopathy.  Skin:    General: Skin is warm and dry.     Findings: No rash.  Neurological:     Mental Status: She is alert and oriented to person, place, and time. Mental status is at baseline.  Psychiatric:        Mood and Affect: Mood normal.        Behavior: Behavior normal.     Depression Screen    08/18/2024    3:01 PM 07/02/2024    1:32 PM 05/26/2024    1:19 PM 05/13/2024    1:46 PM  PHQ 2/9 Scores  PHQ - 2 Score 0 0 2 4  PHQ- 9  Score 0 0  6  10      Data saved with a previous flowsheet row definition   No results found for any visits on 08/18/24.  Assessment & Plan      Problem List Items Addressed This Visit       Genitourinary   Herpes genitalis   Managed with Valtrex  as needed, particularly during periods of stress or sexual activity. - Continue Valtrex  as needed for herpes outbreaks.        Other   Generalized anxiety disorder with panic attacks - Primary   Managed with Lexapro , but she reports emotional flatness, dry mouth, and weight gain. Considering switching to Zoloft due to side effects and lack of efficacy at higher doses. Zoloft is preferred for its broader dose range and potential for fewer side effects. Discussed potential for jitteriness and the need for a gradual transition. Zoloft may take up to six weeks to show full effect. - Switched from Lexapro  to Zoloft 50 mg daily. - Instructed to monitor for side effects and efficacy over six weeks. - Continue Klonopin  as needed for acute anxiety. - Scheduled follow-up in six weeks to assess response to Zoloft.      Relevant Medications   sertraline (ZOLOFT) 50 MG tablet   Other Visit Diagnoses       Lateral epicondylitis of left elbow         Elevated blood pressure reading without diagnosis of hypertension               Obesity Managed with semaglutide, but she reports difficulty losing weight after restarting the medication. Discussed the difference in efficacy between compounded semaglutide and FDA-approved Wegovy. She is  interested in switching to [facility] for better control and dosing flexibility. BMI does not qualify for insurance coverage, but she is willing to pay out of pocket. - Prescribed Wegovy starting at 0.25 mg weekly for one month, then increase to 0.5 mg weekly. - Sent prescription to [facility] order pharmacy. - Monitor weight and adjust dosage as needed.  Elevated blood pressure Blood pressure recorded at 151/89  mmHg. Discussed potential contributing factors including sodium intake and recent physical activity. Advised to monitor blood pressure at home and maintain a low sodium diet. Liquid IVs may contribute to elevated sodium levels. - Monitor blood pressure at home and maintain a log. - Maintain a low sodium diet, aiming for less than 2000 mg of sodium per day. - Will reassess blood pressure at follow-up in six weeks.  Lateral epicondylitis, left elbow Left elbow pain consistent with lateral epicondylitis, likely due to overuse. Pain exacerbated by certain movements. Discussed use of ice, topical treatments, and activity modification. - Apply ice to the affected area. - Use Tiger Balm for symptomatic relief. - Avoid movements that exacerbate pain. - Provided exercises for lateral epicondylitis via MyChart.  Occipital neuralgia Pain managed with Tiger Balm and supportive measures. - Continue using Tiger Balm for pain relief. - Use a neck massager and heating pad as needed.  Migraine and headache disorder Discussed potential for Zoloft to help with headache prevention. - Continue to monitor headache frequency and severity with Zoloft initiation.       Return in about 6 weeks (around 09/29/2024) for GAD f/u, virtual ok.     I personally spent a total of 42 minutes in the care of the patient today including preparing to see the patient, getting/reviewing separately obtained history, performing a medically appropriate exam/evaluation, counseling and educating, placing orders, documenting clinical information in the EHR, and coordinating care.   Jon Eva, MD  [facility] [phone] (phone) [phone] (fax)  [facility]

## 2024-08-19 ENCOUNTER — Encounter: Payer: Self-pay | Admitting: Family Medicine

## 2024-09-09 ENCOUNTER — Other Ambulatory Visit: Payer: Self-pay | Admitting: Family Medicine

## 2024-09-13 LAB — GENECONNECT MOLECULAR SCREEN: Genetic Analysis Overall Interpretation: NEGATIVE

## 2024-09-15 ENCOUNTER — Ambulatory Visit: Admitting: Family Medicine

## 2024-09-16 ENCOUNTER — Ambulatory Visit: Admitting: Family Medicine

## 2024-09-16 ENCOUNTER — Encounter: Payer: Self-pay | Admitting: Family Medicine

## 2024-09-16 VITALS — BP 109/77 | HR 96 | Ht 71.0 in | Wt 182.5 lb

## 2024-09-16 DIAGNOSIS — F411 Generalized anxiety disorder: Secondary | ICD-10-CM

## 2024-09-16 DIAGNOSIS — F410 Panic disorder [episodic paroxysmal anxiety] without agoraphobia: Secondary | ICD-10-CM

## 2024-09-16 DIAGNOSIS — M5481 Occipital neuralgia: Secondary | ICD-10-CM

## 2024-09-16 DIAGNOSIS — E663 Overweight: Secondary | ICD-10-CM | POA: Insufficient documentation

## 2024-09-16 DIAGNOSIS — R030 Elevated blood-pressure reading, without diagnosis of hypertension: Secondary | ICD-10-CM

## 2024-09-16 DIAGNOSIS — M542 Cervicalgia: Secondary | ICD-10-CM

## 2024-09-16 MED ORDER — SERTRALINE HCL 100 MG PO TABS: 100.0000 mg | ORAL_TABLET | Freq: Every day | ORAL | 1 refills | Status: DC

## 2024-09-16 NOTE — Assessment & Plan Note (Signed)
 Currently on Wegovy  for weight management. Reports no nausea and some initial lightheadedness, which has improved. Plans to increase Wegovy  dose to 0.5 mg after completing current 0.25 mg dose. Discussed the importance of not rushing to higher doses and maintaining doses in reserve for future use if needed. Discussed the benefits of Wegovy  and the potential for insurance coverage improvements. - Increase Wegovy  to 0.5 mg after completing current 0.25 mg dose. - Will reassess weight management progress in 4-6 weeks.

## 2024-09-16 NOTE — Progress Notes (Signed)
 Established patient visit   Patient: Dana Patel   DOB: 10-Feb-1971   53 y.o. Female  MRN: [id number] Visit Date: 09/16/2024  Today's healthcare provider: Jon Eva, MD   Chief Complaint  Patient presents with   Medical Management of Chronic Issues   Anxiety    She was last seen for anxiety 4 weeks ago. Changes made at last visit include switched from Lexapro  to Zoloft  50 mg daily. She reports excellent compliance with treatment. She reports good tolerance of treatment but using a 0.5 tablet of clonidine as buffer in the last week as needed.  She is not having side effects.  She feels her anxiety is mild and improved but in the last 2 weeks since lexapro  has been out of system she feels uneasy at times  since last visit. Symptoms: irritable (mainly at night)   Subjective    Anxiety     HPI     Anxiety    Additional comments: She was last seen for anxiety 4 weeks ago. Changes made at last visit include switched from Lexapro  to Zoloft  50 mg daily. She reports excellent compliance with treatment. She reports good tolerance of treatment but using a 0.5 tablet of clonidine as buffer in the last week as needed.  She is not having side effects.  She feels her anxiety is mild and improved but in the last 2 weeks since lexapro  has been out of system she feels uneasy at times  since last visit. Symptoms: irritable (mainly at night)      Last edited by Lilian Fitzpatrick, CMA on 09/16/2024  1:11 PM.       Discussed the use of AI scribe software for clinical note transcription with the patient, who gave verbal consent to proceed.  History of Present Illness   Dana Patel is a 53 year old female who presents with concerns about her current Zoloft  dosage and its effects.  She has been on Zoloft  for four weeks after switching from Lexapro . She initially felt well but now feels more activated and not yet at a therapeutic level on 50 mg. Her prior cough resolved after the  switch.  She lives with her elderly parents and finds this stressful after work, with limited time to decompress during her short drive home. She feels this contributes to feeling revved and in need of quiet time.  She uses Wegovy  for weight management and recently increased from 0.25 mg to 0.5 mg. She had brief lightheadedness when starting Wegovy  and Zoloft  together, without nausea, and eases symptoms by pairing carbohydrates with protein.  She has neck pain and occipital neuralgia that she feels worsen her anxiety. She suspects work tension and eye strain from screens contribute. She uses a mouth guard at night for jaw clenching related to tension.  She has elevated blood pressure in the office that she attributes to white coat hypertension. Her home readings are normal, and she stopped electrolyte supplements to reduce sodium.          08/18/2024    3:01 PM 07/02/2024    1:32 PM 05/26/2024    1:19 PM 05/13/2024    1:46 PM 01/22/2024    2:36 PM  Depression screen PHQ 2/9  Decreased Interest 0 0 1 2 1   Down, Depressed, Hopeless 0 0 1 2 1   PHQ - 2 Score 0 0 2 4 2   Altered sleeping 0 0 1 2 1   Tired, decreased energy 0 0 1 1 1  Change in appetite 0 0 2 2 1   Feeling bad or failure about yourself  0 0 0 0 1  Trouble concentrating 0 0 0 1 1  Moving slowly or fidgety/restless 0 0 0 0 1  Suicidal thoughts 0 0 0 0 0  PHQ-9 Score 0 0  6  10  8    Difficult doing work/chores  Not difficult at all Somewhat difficult Not difficult at all Not difficult at all     Data saved with a previous flowsheet row definition       08/18/2024    3:01 PM 07/02/2024    1:33 PM 05/26/2024    1:20 PM 05/13/2024    1:47 PM  GAD 7 : Generalized Anxiety Score  Nervous, Anxious, on Edge 1 0 2 2  Control/stop worrying 0 0 2 2  Worry too much - different things 0 0 2 2  Trouble relaxing 0 0 1 2  Restless 0 0 0 0  Easily annoyed or irritable 0 0 0 0  Afraid - awful might happen 0 0 1 1  Total GAD 7 Score 1 0  8 9  Anxiety Difficulty Not difficult at all Not difficult at all Somewhat difficult Somewhat difficult      Medications: Outpatient Medications Prior to Visit  Medication Sig   clonazePAM  (KLONOPIN ) 0.5 MG tablet Take 1 tablet (0.5 mg total) by mouth 2 (two) times daily as needed for anxiety.   norethindrone-ethinyl estradiol-FE (JUNEL  FE 1/20) 1-20 MG-MCG tablet TAKE 1 TABLET BY MOUTH EVERY DAY , CONTNUOUS DOSING AS DIRECTED   ondansetron  (ZOFRAN ) 4 MG tablet Take 4 mg by mouth.   semaglutide -weight management (WEGOVY ) 0.25 MG/0.5ML SOAJ SQ injection Inject 0.25 mg into the skin once a week.   semaglutide -weight management (WEGOVY ) 0.5 MG/0.5ML SOAJ SQ injection Inject 0.5 mg into the skin once a week.   valACYclovir  (VALTREX ) 500 MG tablet TAKE 1 TABLET BY MOUTH TWICE A DAY FOR 3 DAYS AS NEEDED FOR SYMPTOMS, OR TAKE 1 DAILY FOR PREVENTION   [DISCONTINUED] sertraline  (ZOLOFT ) 50 MG tablet TAKE 1 TABLET BY MOUTH EVERY DAY   No facility-administered medications prior to visit.    Review of Systems     Objective    BP 109/77 Comment: home reading at 9:15 am  Pulse 96   Ht 5' 11 (1.803 m)   Wt 182 lb 8 oz (82.8 kg)   LMP 12/06/2020 (Approximate)   SpO2 100%   BMI 25.45 kg/m    Physical Exam Vitals reviewed.  Constitutional:      General: She is not in acute distress.    Appearance: Normal appearance. She is well-developed. She is not diaphoretic.  HENT:     Head: Normocephalic and atraumatic.  Eyes:     General: No scleral icterus.    Conjunctiva/sclera: Conjunctivae normal.  Neck:     Thyroid: No thyromegaly.  Cardiovascular:     Rate and Rhythm: Normal rate and regular rhythm.     Heart sounds: Normal heart sounds. No murmur heard. Pulmonary:     Effort: Pulmonary effort is normal. No respiratory distress.     Breath sounds: Normal breath sounds. No wheezing, rhonchi or rales.  Musculoskeletal:     Cervical back: Neck supple.     Right lower leg: No edema.      Left lower leg: No edema.  Lymphadenopathy:     Cervical: No cervical adenopathy.  Skin:    General: Skin is warm and dry.  Neurological:  Mental Status: She is alert. Mental status is at baseline.  Psychiatric:        Mood and Affect: Mood normal.        Behavior: Behavior normal.      No results found for any visits on 09/16/24.  Assessment & Plan     Problem List Items Addressed This Visit       Other   Generalized anxiety disorder with panic attacks - Primary   Reports increased activation and feeling more revved, especially in the evenings after work. Current dose of Zoloft  (50 mg) may not be therapeutic yet. No full panic attacks, but increased anxiety in stressful situations. Behavioral strategies discussed to manage stress and anxiety, including decompression time and setting boundaries with family. - Increased Zoloft  to 100 mg daily. - Will check in 4-6 weeks to assess response to increased Zoloft  dose. - Encouraged behavioral strategies for stress management, including decompression time and setting boundaries.      Relevant Medications   sertraline  (ZOLOFT ) 100 MG tablet   Overweight   Currently on Wegovy  for weight management. Reports no nausea and some initial lightheadedness, which has improved. Plans to increase Wegovy  dose to 0.5 mg after completing current 0.25 mg dose. Discussed the importance of not rushing to higher doses and maintaining doses in reserve for future use if needed. Discussed the benefits of Wegovy  and the potential for insurance coverage improvements. - Increase Wegovy  to 0.5 mg after completing current 0.25 mg dose. - Will reassess weight management progress in 4-6 weeks.      Other Visit Diagnoses       White coat syndrome without diagnosis of hypertension         Bilateral occipital neuralgia       Relevant Medications   sertraline  (ZOLOFT ) 100 MG tablet     Cervicalgia               Elevated blood pressure without  hypertension Reports elevated blood pressure readings in the office, likely due to white coat hypertension. Home blood pressure readings are satisfactory. Discontinued electrolyte supplements due to high sodium content. - Continue monitoring blood pressure at home. - Discontinued electrolyte supplements.  Occipital neuralgia and cervicalgia Reports increased neck pain and occipital neuralgia, possibly exacerbated by tension from work and screen use. Plans to try dry needling with a physical therapist. Discussed potential link between tension and anxiety. - Proceed with dry needling therapy as recommended by neurologist. - Monitor for improvement in symptoms.       Return in about 4 weeks (around 10/14/2024) for MDD/GAD f/u.      I personally spent a total of 43 minutes in the care of the patient today including preparing to see the patient, getting/reviewing separately obtained history, performing a medically appropriate exam/evaluation, counseling and educating, placing orders, documenting clinical information in the EHR, and coordinating care.   Jon Eva, MD  [facility] [phone] (phone) [phone] (fax)  [facility]

## 2024-09-16 NOTE — Assessment & Plan Note (Signed)
 Reports increased activation and feeling more revved, especially in the evenings after work. Current dose of Zoloft  (50 mg) may not be therapeutic yet. No full panic attacks, but increased anxiety in stressful situations. Behavioral strategies discussed to manage stress and anxiety, including decompression time and setting boundaries with family. - Increased Zoloft  to 100 mg daily. - Will check in 4-6 weeks to assess response to increased Zoloft  dose. - Encouraged behavioral strategies for stress management, including decompression time and setting boundaries.

## 2024-09-16 NOTE — Progress Notes (Deleted)
 She was last seen for anxiety 4 weeks ago. Changes made at last visit include switched from Lexapro  to Zoloft  50 mg daily. She reports {excellent/good/fair/poor:19665} compliance with treatment. She reports {good/fair/poor:18685} tolerance of treatment. She {is/is not:21021397} having side effects. {document side effects if present:1} She feels her anxiety is {Desc; severity:60313} and {improved/worse/unchanged:3041574} since last visit. Symptoms: {Yes/No:20286} chest pain {Yes/No:20286} difficulty concentrating  {Yes/No:20286} dizziness {Yes/No:20286} fatigue  {Yes/No:20286} feelings of losing control {Yes/No:20286} insomnia  {Yes/No:20286} irritable {Yes/No:20286} palpitations  {Yes/No:20286} panic attacks {Yes/No:20286} racing thoughts  {Yes/No:20286} shortness of breath {Yes/No:20286} sweating  {Yes/No:20286} tremors/shakes

## 2024-09-17 NOTE — Progress Notes (Signed)
 Patient reported cologuard kit available at home on 07/02/24 when in office seeing Dr.Pardue. [employer] postponed until completed and returned

## 2024-09-23 ENCOUNTER — Ambulatory Visit: Admitting: Family Medicine

## 2024-10-08 ENCOUNTER — Other Ambulatory Visit: Payer: Self-pay | Admitting: Family Medicine

## 2024-10-14 ENCOUNTER — Encounter: Payer: Self-pay | Admitting: Family Medicine

## 2024-10-14 ENCOUNTER — Ambulatory Visit: Admitting: Family Medicine

## 2024-10-14 VITALS — BP 131/84 | HR 92 | Resp 16 | Ht 71.0 in | Wt 185.9 lb

## 2024-10-14 DIAGNOSIS — F410 Panic disorder [episodic paroxysmal anxiety] without agoraphobia: Secondary | ICD-10-CM

## 2024-10-14 DIAGNOSIS — E663 Overweight: Secondary | ICD-10-CM | POA: Diagnosis not present

## 2024-10-14 DIAGNOSIS — F411 Generalized anxiety disorder: Secondary | ICD-10-CM

## 2024-10-14 DIAGNOSIS — K219 Gastro-esophageal reflux disease without esophagitis: Secondary | ICD-10-CM | POA: Diagnosis not present

## 2024-10-14 NOTE — Assessment & Plan Note (Signed)
 Dry cough likely due to silent reflux, exacerbated at night. Managed with Pepcid  AC and lifestyle modifications. Wegovy  may contribute to reflux symptoms. - Continue Pepcid  AC 30-45 minutes before bed. - Avoid eating 2-3 hours before bedtime and consume lighter meals in the evening. - Avoid spicy, chocolatey, and acidic foods. - Use nasal saline spray and humidifier for dryness.

## 2024-10-14 NOTE — Progress Notes (Signed)
 "     Established patient visit   Patient: Dana Patel   DOB: [birth year]   54 y.o. Female  MRN: [id number] Visit Date: 10/14/2024  Today's healthcare provider: Jon Eva, MD   Chief Complaint  Patient presents with   PHQ-9 4 Week Follow-up   Subjective    HPI   Discussed the use of AI scribe software for clinical note transcription with the patient, who gave verbal consent to proceed.  History of Present Illness   Dana Patel is a 54 year old female with generalized anxiety disorder and panic attacks who presents for follow-up on medication management.  She was last seen on September 16, 2024, for generalized anxiety disorder with panic attacks and increasing anxiety in stressful situations despite partial response to Zoloft  50 mg daily. Zoloft  was increased to 100 mg daily. She feels this has stabilized her mood, reduced emotional waves and flatness, and she feels less anxious in situations that previously triggered anxiety, including crowded grocery stores.  A few weeks after increasing Zoloft  to 100 mg daily, she developed a mostly nocturnal dry cough. She suspects reflux as a cause. Pepcid  AC 30 to 45 minutes before bed and sleeping with her head elevated have improved the cough. She did not notice this cough while on 50 mg.  She is taking Wegovy  for weight management, with the dose increased from 0.25 mg to 0.5 mg weekly at the last visit. She notes slight weight gain over the holidays, which she attributes to less activity and dietary choices, and plans to continue the current dose while improving diet and exercise.  She previously used Lexapro  but stopped it due to weight gain and a persistent dry cough. She again notes feeling less anxious in previously anxiety-provoking settings like crowded stores. She continues to have a mostly nocturnal dry cough, suspects reflux, denies typical heartburn, and notes dry mouth in the morning.          10/14/2024   11:12 AM  08/18/2024    3:01 PM 07/02/2024    1:32 PM 05/26/2024    1:19 PM 05/13/2024    1:46 PM  Depression screen PHQ 2/9  Decreased Interest 0 0 0 1 2  Down, Depressed, Hopeless 0 0 0 1 2  PHQ - 2 Score 0 0 0 2 4  Altered sleeping 0 0 0 1 2  Tired, decreased energy 0 0 0 1 1  Change in appetite 0 0 0 2 2  Feeling bad or failure about yourself  0 0 0 0 0  Trouble concentrating 0 0 0 0 1  Moving slowly or fidgety/restless 0 0 0 0 0  Suicidal thoughts 0 0 0 0 0  PHQ-9 Score 0 0 0  6  10   Difficult doing work/chores   Not difficult at all Somewhat difficult Not difficult at all     Data saved with a previous flowsheet row definition       10/14/2024   11:12 AM 08/18/2024    3:01 PM 07/02/2024    1:33 PM 05/26/2024    1:20 PM  GAD 7 : Generalized Anxiety Score  Nervous, Anxious, on Edge 0 1 0 2  Control/stop worrying 0 0 0 2  Worry too much - different things 0 0 0 2  Trouble relaxing 0 0 0 1  Restless 0 0 0 0  Easily annoyed or irritable 0 0 0 0  Afraid - awful might happen 0 0 0 1  Total  GAD 7 Score 0 1 0 8  Anxiety Difficulty  Not difficult at all Not difficult at all Somewhat difficult      Medications: Show/hide medication list[1]  Review of Systems     Objective    BP 131/84   Pulse 92   Resp 16   Ht 5' 11 (1.803 m)   Wt 185 lb 14.4 oz (84.3 kg)   LMP 12/06/2020   SpO2 98%   BMI 25.93 kg/m  BP Readings from Last 3 Encounters:  10/14/24 131/84  09/16/24 109/77  08/18/24 (!) 152/88   Wt Readings from Last 3 Encounters:  10/14/24 185 lb 14.4 oz (84.3 kg)  09/16/24 182 lb 8 oz (82.8 kg)  08/18/24 186 lb 6.4 oz (84.6 kg)      Physical Exam Vitals reviewed.  Constitutional:      General: She is not in acute distress.    Appearance: She is well-developed.  HENT:     Head: Normocephalic and atraumatic.  Eyes:     General: No scleral icterus.    Conjunctiva/sclera: Conjunctivae normal.  Cardiovascular:     Rate and Rhythm: Normal rate and regular rhythm.   Pulmonary:     Effort: Pulmonary effort is normal. No respiratory distress.  Skin:    General: Skin is warm and dry.     Findings: No rash.  Neurological:     Mental Status: She is alert and oriented to person, place, and time.  Psychiatric:        Behavior: Behavior normal.      No results found for any visits on 10/14/24.  Assessment & Plan     Problem List Items Addressed This Visit       Digestive   Gastroesophageal reflux disease without esophagitis   Dry cough likely due to silent reflux, exacerbated at night. Managed with Pepcid  AC and lifestyle modifications. Wegovy  may contribute to reflux symptoms. - Continue Pepcid  AC 30-45 minutes before bed. - Avoid eating 2-3 hours before bedtime and consume lighter meals in the evening. - Avoid spicy, chocolatey, and acidic foods. - Use nasal saline spray and humidifier for dryness.      Relevant Medications   famotidine  (PEPCID ) 20 MG tablet     Other   Generalized anxiety disorder with panic attacks - Primary   Improvement on Zoloft  100 mg daily with reduced panic symptoms and emotional stability. Occasional anxiety persists in stressful situations. Dry cough possibly related to Zoloft  or Wegovy , managed with Pepcid  AC. - Continue Zoloft  100 mg daily. - Continue Pepcid  AC 30-45 minutes before bed for dry cough. - Scheduled a 21-month follow-up to assess progress.      Overweight   Weight management with Wegovy  0.5 mg weekly. Recent weight gain attributed to holiday indulgence and lack of exercise. Discussed potential switch to oral medication (Rybelsus) but noted higher GI side effects and cost considerations. Prefers injectable form due to ease of use and lower side effects. - Continue Wegovy  0.5 mg weekly. - Encouraged regular exercise and dietary modifications. - Will consider increasing Wegovy  to 1 mg if needed after current supply. - Scheduled a 75-month follow-up to assess weight management progress.            General Health Maintenance Due for shingles vaccine and tetanus booster. Cologuard test pending for colon cancer screening. Mammogram and Pap smear managed by OB GYN. - Schedule shingles vaccine and tetanus booster. - Complete Cologuard test for colon cancer screening. - Continue routine mammogram and  Pap smear with OB GYN.        Return in about 3 months (around 01/12/2025) for CPE.       Jon Eva, MD  [facility] [phone] (phone) [phone] (fax)  [facility]     [1]  Outpatient Medications Prior to Visit  Medication Sig   clonazePAM  (KLONOPIN ) 0.5 MG tablet Take 1 tablet (0.5 mg total) by mouth 2 (two) times daily as needed for anxiety.   famotidine  (PEPCID ) 20 MG tablet Take 20 mg by mouth at bedtime.   norethindrone-ethinyl estradiol-FE (JUNEL  FE 1/20) 1-20 MG-MCG tablet TAKE 1 TABLET BY MOUTH EVERY DAY , CONTNUOUS DOSING AS DIRECTED   ondansetron  (ZOFRAN ) 4 MG tablet Take 4 mg by mouth.   semaglutide -weight management (WEGOVY ) 0.5 MG/0.5ML SOAJ SQ injection Inject 0.5 mg into the skin once a week.   sertraline  (ZOLOFT ) 100 MG tablet TAKE 1 TABLET BY MOUTH EVERY DAY   valACYclovir  (VALTREX ) 500 MG tablet TAKE 1 TABLET BY MOUTH TWICE A DAY FOR 3 DAYS AS NEEDED FOR SYMPTOMS, OR TAKE 1 DAILY FOR PREVENTION   [DISCONTINUED] semaglutide -weight management (WEGOVY ) 0.25 MG/0.5ML SOAJ SQ injection Inject 0.25 mg into the skin once a week.   No facility-administered medications prior to visit.   "

## 2024-10-14 NOTE — Assessment & Plan Note (Signed)
 Weight management with Wegovy  0.5 mg weekly. Recent weight gain attributed to holiday indulgence and lack of exercise. Discussed potential switch to oral medication (Rybelsus) but noted higher GI side effects and cost considerations. Prefers injectable form due to ease of use and lower side effects. - Continue Wegovy  0.5 mg weekly. - Encouraged regular exercise and dietary modifications. - Will consider increasing Wegovy  to 1 mg if needed after current supply. - Scheduled a 48-month follow-up to assess weight management progress.

## 2024-10-14 NOTE — Assessment & Plan Note (Signed)
 Improvement on Zoloft  100 mg daily with reduced panic symptoms and emotional stability. Occasional anxiety persists in stressful situations. Dry cough possibly related to Zoloft  or Wegovy , managed with Pepcid  AC. - Continue Zoloft  100 mg daily. - Continue Pepcid  AC 30-45 minutes before bed for dry cough. - Scheduled a 16-month follow-up to assess progress.

## 2024-11-06 ENCOUNTER — Encounter: Payer: Self-pay | Admitting: Family Medicine

## 2024-11-06 DIAGNOSIS — E663 Overweight: Secondary | ICD-10-CM

## 2024-11-06 MED ORDER — WEGOVY 1 MG/0.5ML ~~LOC~~ SOAJ: 1.0000 mg | SUBCUTANEOUS | 3 refills | Status: AC

## 2024-11-06 NOTE — Telephone Encounter (Signed)
 Ok to send in 1mg  weekly dose as requeted to [facility]
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
## 2024-11-07 ENCOUNTER — Other Ambulatory Visit: Payer: Self-pay | Admitting: Obstetrics and Gynecology

## 2024-11-07 ENCOUNTER — Telehealth: Payer: Self-pay

## 2024-11-07 DIAGNOSIS — Z3041 Encounter for surveillance of contraceptive pills: Secondary | ICD-10-CM

## 2024-11-07 NOTE — Telephone Encounter (Signed)
 Pt called and needed one month of her BC pills sent in. She already has 3 pack but stated she needed one more to last until June. One pack sent in. Pt aware. Chaye Misch cma

## 2024-11-11 ENCOUNTER — Telehealth: Payer: Self-pay | Admitting: Family Medicine

## 2024-11-11 DIAGNOSIS — E663 Overweight: Secondary | ICD-10-CM

## 2024-11-13 ENCOUNTER — Other Ambulatory Visit: Payer: Self-pay

## 2024-11-13 DIAGNOSIS — Z3041 Encounter for surveillance of contraceptive pills: Secondary | ICD-10-CM

## 2024-11-13 MED ORDER — JUNEL FE 1/20 1-20 MG-MCG PO TABS: ORAL_TABLET | ORAL | 2 refills | Status: AC

## 2024-11-13 NOTE — Telephone Encounter (Signed)
 Patient called because her insurance will only cover her birth control if they dispense 90 day supply (which is 4 packs as she takes them continuously).  She had called last week and we attempted to authorize one additional pack for her, but the pharmacy says they need it written on a single prescription to dispense 90 days (not one prescription for 60 days and another for 30).  Rx adjusted and refills authorized until her annual in July 2026.

## 2024-11-13 NOTE — Telephone Encounter (Signed)
 Looks like this was sent to the wrong [facility].  Please sent to correct pharmacy.

## 2024-11-13 NOTE — Telephone Encounter (Signed)
 Pt reports she does not need rx sent to [facility] on [address] as she has already picked it up from [facility] locations. She reports when taking the lower dose she had rx switched to [email or web address] because [facility] did not have any available and she does apologize for confusion. Current rx is okay.    Rx request declined

## 2025-01-08 ENCOUNTER — Encounter: Admitting: Family Medicine
